# Patient Record
Sex: Male | Born: 1940 | Race: White | Hispanic: No | Marital: Married | Smoking: Never smoker
Health system: Southern US, Community
[De-identification: ages and names within clinical notes are randomized; demographics above are authoritative.]

## PROBLEM LIST (undated history)

## (undated) DIAGNOSIS — Z9359 Other cystostomy status: Secondary | ICD-10-CM

## (undated) DIAGNOSIS — R112 Nausea with vomiting, unspecified: Secondary | ICD-10-CM

## (undated) DIAGNOSIS — K219 Gastro-esophageal reflux disease without esophagitis: Secondary | ICD-10-CM

## (undated) DIAGNOSIS — H409 Unspecified glaucoma: Secondary | ICD-10-CM

## (undated) DIAGNOSIS — Z9889 Other specified postprocedural states: Secondary | ICD-10-CM

## (undated) DIAGNOSIS — N200 Calculus of kidney: Secondary | ICD-10-CM

## (undated) DIAGNOSIS — N390 Urinary tract infection, site not specified: Secondary | ICD-10-CM

## (undated) DIAGNOSIS — C801 Malignant (primary) neoplasm, unspecified: Secondary | ICD-10-CM

## (undated) DIAGNOSIS — I10 Essential (primary) hypertension: Secondary | ICD-10-CM

## (undated) HISTORY — PX: PROSTATECTOMY: SHX69

---

## 2013-12-17 DIAGNOSIS — N390 Urinary tract infection, site not specified: Secondary | ICD-10-CM

## 2013-12-17 HISTORY — DX: Urinary tract infection, site not specified: N39.0

## 2015-04-28 NOTE — Progress Notes (Signed)
 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   Plan:  Will first plan local cystoscopy to eval bladder neck. Will schedule at [place] next week. Explained issues with recurrent bladder neck contracture and difficulties with management. Ideally, resection with CIC would be best option. Explained risk of SUI after these interventions.  Referring Physician:  Lynwood LITTIE Fruits, MD [address] Emergency [facility] [id number] [facility] [place], [place] [address]  PCP:  WONDA VEAR HENDERSON, NP  No chief complaint on file.   Subjective:  HPI:  74 y.o. male seen in consultation at the request of Fruits Lynwood LITTIE, MD for evaluation. The patient has had no chief complaint listed for this encounter. 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 Had prostate ca followed by Dr. Sallyanne in [place]. Underwent open RRP 10/2014. Stayed in hospital 9 days. Had foley for one month.   After removal of foley, immediately began having issues with inability to void and dribbling urine. Found to have bladder neck contracture requiring multiple dilations and procedures. Tried a period of CIC, but became unable to pass catheter. Has essentially had an indwelling catheter off and on for several months.  Just had new catheter placed yesterday. Plan was to keep in place for 2 weeks.  +fam hx of prostate ca in both brothers.  No records on path - daughter says Gleason 7, with what sounds like positive margin. But plan was to just monitor psa.  PMH: Past Medical History  Diagnosis Date  . Hypertension   . Depression   . GERD (gastroesophageal reflux disease)   . Hiatal hernia   . Cancer ([facility]-HCC)     prostate    PSH: Past Surgical History  Procedure Laterality Date  . Retropubic prostatectomy  10/2014    [place]   . Cystoscopy w/ ureteroscopy w/ lithotripsy      Medications: Current Outpatient Prescriptions  Medication Sig Dispense Refill  .  amLODIPine-benazepril (LOTREL) 10-20 mg per capsule Take 1 capsule by mouth daily.    SABRA aspirin (ECOTRIN) 81 MG tablet Take 81 mg by mouth daily.    SABRA DEXILANT 60 mg capsule Take 1 capsule by mouth daily. As directed    . escitalopram oxalate (LEXAPRO) 20 MG tablet Take 20 mg by mouth daily.    . levofloxacin  (LEVAQUIN ) 500 MG tablet Take 500 mg by mouth daily.     No current facility-administered medications for this visit.    Allergies: Review of patient's allergies indicates no known allergies.   Social History: Patient  reports that he has never smoked. He does not have any smokeless tobacco history on file. He reports that he does not drink alcohol or use illicit drugs.   Family History: The patient's family history is not on file.   ROS:  A comprehensive 10-system review was negative, except as noted in HPI.   BP 145/80 mmHg  Pulse 79  Temp(Src) 36.3 C (97.3 F) (Oral)  Ht 180.3 cm (5' 11)  Wt 112.81 kg (248 lb 11.2 oz)  BMI 34.70 kg/m2  Physical Exam:  General: well developed, well nourished, no acute distress HEENT: PERLA, EOM intact, normocephalic, atraumatic Neck: supple and no masses Chest: symmetrical Lungs: non-labored breathing Heart: normal rhythm, no JVD Abdomen: no tenderness, no masses or hernias, no palpable organomegaly, healed low midline incision GU: circ, nl glans/meatus, testicles normal bilaterally, urine draining clear Rectal: Extremities: no deformities, no edema, no cyanosis

## 2015-12-20 DIAGNOSIS — N3946 Mixed incontinence: Secondary | ICD-10-CM | POA: Diagnosis not present

## 2015-12-20 DIAGNOSIS — N318 Other neuromuscular dysfunction of bladder: Secondary | ICD-10-CM | POA: Diagnosis not present

## 2015-12-20 DIAGNOSIS — N3020 Other chronic cystitis without hematuria: Secondary | ICD-10-CM | POA: Diagnosis not present

## 2015-12-20 DIAGNOSIS — N3090 Cystitis, unspecified without hematuria: Secondary | ICD-10-CM | POA: Diagnosis not present

## 2015-12-20 DIAGNOSIS — N320 Bladder-neck obstruction: Secondary | ICD-10-CM | POA: Diagnosis not present

## 2015-12-23 DIAGNOSIS — J329 Chronic sinusitis, unspecified: Secondary | ICD-10-CM | POA: Diagnosis not present

## 2015-12-23 DIAGNOSIS — E669 Obesity, unspecified: Secondary | ICD-10-CM | POA: Diagnosis not present

## 2015-12-23 DIAGNOSIS — I10 Essential (primary) hypertension: Secondary | ICD-10-CM | POA: Diagnosis not present

## 2015-12-23 DIAGNOSIS — F324 Major depressive disorder, single episode, in partial remission: Secondary | ICD-10-CM | POA: Diagnosis not present

## 2015-12-23 DIAGNOSIS — K219 Gastro-esophageal reflux disease without esophagitis: Secondary | ICD-10-CM | POA: Diagnosis not present

## 2015-12-23 DIAGNOSIS — E119 Type 2 diabetes mellitus without complications: Secondary | ICD-10-CM | POA: Diagnosis not present

## 2015-12-23 DIAGNOSIS — Z8546 Personal history of malignant neoplasm of prostate: Secondary | ICD-10-CM | POA: Diagnosis not present

## 2016-01-10 DIAGNOSIS — F324 Major depressive disorder, single episode, in partial remission: Secondary | ICD-10-CM | POA: Diagnosis not present

## 2016-01-10 DIAGNOSIS — E669 Obesity, unspecified: Secondary | ICD-10-CM | POA: Diagnosis not present

## 2016-01-10 DIAGNOSIS — I10 Essential (primary) hypertension: Secondary | ICD-10-CM | POA: Diagnosis not present

## 2016-01-10 DIAGNOSIS — N390 Urinary tract infection, site not specified: Secondary | ICD-10-CM | POA: Diagnosis not present

## 2016-01-10 DIAGNOSIS — R319 Hematuria, unspecified: Secondary | ICD-10-CM | POA: Diagnosis not present

## 2016-01-10 DIAGNOSIS — K219 Gastro-esophageal reflux disease without esophagitis: Secondary | ICD-10-CM | POA: Diagnosis not present

## 2016-01-10 DIAGNOSIS — Z9079 Acquired absence of other genital organ(s): Secondary | ICD-10-CM | POA: Diagnosis not present

## 2016-01-19 DIAGNOSIS — N3090 Cystitis, unspecified without hematuria: Secondary | ICD-10-CM | POA: Diagnosis not present

## 2016-01-19 DIAGNOSIS — N320 Bladder-neck obstruction: Secondary | ICD-10-CM | POA: Diagnosis not present

## 2016-01-19 DIAGNOSIS — C61 Malignant neoplasm of prostate: Secondary | ICD-10-CM | POA: Diagnosis not present

## 2016-01-19 DIAGNOSIS — N3946 Mixed incontinence: Secondary | ICD-10-CM | POA: Diagnosis not present

## 2016-01-19 DIAGNOSIS — N318 Other neuromuscular dysfunction of bladder: Secondary | ICD-10-CM | POA: Diagnosis not present

## 2016-01-19 DIAGNOSIS — N3020 Other chronic cystitis without hematuria: Secondary | ICD-10-CM | POA: Diagnosis not present

## 2016-02-20 DIAGNOSIS — N3090 Cystitis, unspecified without hematuria: Secondary | ICD-10-CM | POA: Diagnosis not present

## 2016-02-20 DIAGNOSIS — N3946 Mixed incontinence: Secondary | ICD-10-CM | POA: Diagnosis not present

## 2016-02-20 DIAGNOSIS — N320 Bladder-neck obstruction: Secondary | ICD-10-CM | POA: Diagnosis not present

## 2016-02-20 DIAGNOSIS — N318 Other neuromuscular dysfunction of bladder: Secondary | ICD-10-CM | POA: Diagnosis not present

## 2016-02-20 DIAGNOSIS — C61 Malignant neoplasm of prostate: Secondary | ICD-10-CM | POA: Diagnosis not present

## 2016-02-20 DIAGNOSIS — N3020 Other chronic cystitis without hematuria: Secondary | ICD-10-CM | POA: Diagnosis not present

## 2016-02-22 DIAGNOSIS — C61 Malignant neoplasm of prostate: Secondary | ICD-10-CM | POA: Diagnosis not present

## 2016-02-22 DIAGNOSIS — N3946 Mixed incontinence: Secondary | ICD-10-CM | POA: Diagnosis not present

## 2016-02-22 DIAGNOSIS — I10 Essential (primary) hypertension: Secondary | ICD-10-CM | POA: Diagnosis not present

## 2016-02-22 DIAGNOSIS — E785 Hyperlipidemia, unspecified: Secondary | ICD-10-CM | POA: Diagnosis not present

## 2016-02-22 DIAGNOSIS — E669 Obesity, unspecified: Secondary | ICD-10-CM | POA: Diagnosis not present

## 2016-02-22 DIAGNOSIS — Z6837 Body mass index (BMI) 37.0-37.9, adult: Secondary | ICD-10-CM | POA: Diagnosis not present

## 2016-02-22 DIAGNOSIS — Z79899 Other long term (current) drug therapy: Secondary | ICD-10-CM | POA: Diagnosis not present

## 2016-02-22 DIAGNOSIS — F419 Anxiety disorder, unspecified: Secondary | ICD-10-CM | POA: Diagnosis not present

## 2016-02-22 DIAGNOSIS — N320 Bladder-neck obstruction: Secondary | ICD-10-CM | POA: Diagnosis not present

## 2016-02-22 DIAGNOSIS — F329 Major depressive disorder, single episode, unspecified: Secondary | ICD-10-CM | POA: Diagnosis not present

## 2016-02-28 DIAGNOSIS — C61 Malignant neoplasm of prostate: Secondary | ICD-10-CM | POA: Diagnosis not present

## 2016-02-28 DIAGNOSIS — N3946 Mixed incontinence: Secondary | ICD-10-CM | POA: Diagnosis not present

## 2016-02-28 DIAGNOSIS — R338 Other retention of urine: Secondary | ICD-10-CM | POA: Diagnosis not present

## 2016-02-28 DIAGNOSIS — N3020 Other chronic cystitis without hematuria: Secondary | ICD-10-CM | POA: Diagnosis not present

## 2016-02-28 DIAGNOSIS — N320 Bladder-neck obstruction: Secondary | ICD-10-CM | POA: Diagnosis not present

## 2016-03-14 DIAGNOSIS — C61 Malignant neoplasm of prostate: Secondary | ICD-10-CM | POA: Diagnosis not present

## 2016-03-14 DIAGNOSIS — N3020 Other chronic cystitis without hematuria: Secondary | ICD-10-CM | POA: Diagnosis not present

## 2016-03-14 DIAGNOSIS — N453 Epididymo-orchitis: Secondary | ICD-10-CM | POA: Diagnosis not present

## 2016-03-14 DIAGNOSIS — N3090 Cystitis, unspecified without hematuria: Secondary | ICD-10-CM | POA: Diagnosis not present

## 2016-03-21 DIAGNOSIS — C61 Malignant neoplasm of prostate: Secondary | ICD-10-CM | POA: Diagnosis not present

## 2016-03-21 DIAGNOSIS — N3946 Mixed incontinence: Secondary | ICD-10-CM | POA: Diagnosis not present

## 2016-03-21 DIAGNOSIS — N3020 Other chronic cystitis without hematuria: Secondary | ICD-10-CM | POA: Diagnosis not present

## 2016-03-21 DIAGNOSIS — R338 Other retention of urine: Secondary | ICD-10-CM | POA: Diagnosis not present

## 2016-03-21 DIAGNOSIS — N453 Epididymo-orchitis: Secondary | ICD-10-CM | POA: Diagnosis not present

## 2016-03-27 DIAGNOSIS — N318 Other neuromuscular dysfunction of bladder: Secondary | ICD-10-CM | POA: Diagnosis not present

## 2016-03-27 DIAGNOSIS — N3020 Other chronic cystitis without hematuria: Secondary | ICD-10-CM | POA: Diagnosis not present

## 2016-03-27 DIAGNOSIS — N453 Epididymo-orchitis: Secondary | ICD-10-CM | POA: Diagnosis not present

## 2016-03-27 DIAGNOSIS — C61 Malignant neoplasm of prostate: Secondary | ICD-10-CM | POA: Diagnosis not present

## 2016-03-27 DIAGNOSIS — N3946 Mixed incontinence: Secondary | ICD-10-CM | POA: Diagnosis not present

## 2016-03-27 DIAGNOSIS — N320 Bladder-neck obstruction: Secondary | ICD-10-CM | POA: Diagnosis not present

## 2016-04-24 DIAGNOSIS — N318 Other neuromuscular dysfunction of bladder: Secondary | ICD-10-CM | POA: Diagnosis not present

## 2016-04-24 DIAGNOSIS — N201 Calculus of ureter: Secondary | ICD-10-CM | POA: Diagnosis not present

## 2016-04-24 DIAGNOSIS — N3090 Cystitis, unspecified without hematuria: Secondary | ICD-10-CM | POA: Diagnosis not present

## 2016-04-24 DIAGNOSIS — C61 Malignant neoplasm of prostate: Secondary | ICD-10-CM | POA: Diagnosis not present

## 2016-04-24 DIAGNOSIS — N3020 Other chronic cystitis without hematuria: Secondary | ICD-10-CM | POA: Diagnosis not present

## 2016-05-16 DIAGNOSIS — L0232 Furuncle of buttock: Secondary | ICD-10-CM | POA: Diagnosis not present

## 2016-05-24 DIAGNOSIS — C61 Malignant neoplasm of prostate: Secondary | ICD-10-CM | POA: Diagnosis not present

## 2016-05-24 DIAGNOSIS — N3946 Mixed incontinence: Secondary | ICD-10-CM | POA: Diagnosis not present

## 2016-05-24 DIAGNOSIS — N3020 Other chronic cystitis without hematuria: Secondary | ICD-10-CM | POA: Diagnosis not present

## 2016-05-24 DIAGNOSIS — N3090 Cystitis, unspecified without hematuria: Secondary | ICD-10-CM | POA: Diagnosis not present

## 2016-05-24 DIAGNOSIS — N320 Bladder-neck obstruction: Secondary | ICD-10-CM | POA: Diagnosis not present

## 2016-06-22 DIAGNOSIS — N3946 Mixed incontinence: Secondary | ICD-10-CM | POA: Diagnosis not present

## 2016-06-22 DIAGNOSIS — N3020 Other chronic cystitis without hematuria: Secondary | ICD-10-CM | POA: Diagnosis not present

## 2016-06-22 DIAGNOSIS — C61 Malignant neoplasm of prostate: Secondary | ICD-10-CM | POA: Diagnosis not present

## 2016-06-22 DIAGNOSIS — N320 Bladder-neck obstruction: Secondary | ICD-10-CM | POA: Diagnosis not present

## 2016-06-22 DIAGNOSIS — N453 Epididymo-orchitis: Secondary | ICD-10-CM | POA: Diagnosis not present

## 2016-06-22 DIAGNOSIS — N3090 Cystitis, unspecified without hematuria: Secondary | ICD-10-CM | POA: Diagnosis not present

## 2016-07-24 DIAGNOSIS — N201 Calculus of ureter: Secondary | ICD-10-CM | POA: Diagnosis not present

## 2016-07-24 DIAGNOSIS — R338 Other retention of urine: Secondary | ICD-10-CM | POA: Diagnosis not present

## 2016-07-24 DIAGNOSIS — R351 Nocturia: Secondary | ICD-10-CM | POA: Diagnosis not present

## 2016-07-24 DIAGNOSIS — N3020 Other chronic cystitis without hematuria: Secondary | ICD-10-CM | POA: Diagnosis not present

## 2016-07-24 DIAGNOSIS — N3090 Cystitis, unspecified without hematuria: Secondary | ICD-10-CM | POA: Diagnosis not present

## 2016-07-24 DIAGNOSIS — N318 Other neuromuscular dysfunction of bladder: Secondary | ICD-10-CM | POA: Diagnosis not present

## 2016-07-24 DIAGNOSIS — N200 Calculus of kidney: Secondary | ICD-10-CM | POA: Diagnosis not present

## 2016-07-31 DIAGNOSIS — N320 Bladder-neck obstruction: Secondary | ICD-10-CM | POA: Diagnosis not present

## 2016-07-31 DIAGNOSIS — C61 Malignant neoplasm of prostate: Secondary | ICD-10-CM | POA: Diagnosis not present

## 2016-07-31 DIAGNOSIS — N3946 Mixed incontinence: Secondary | ICD-10-CM | POA: Diagnosis not present

## 2016-07-31 DIAGNOSIS — N3020 Other chronic cystitis without hematuria: Secondary | ICD-10-CM | POA: Diagnosis not present

## 2016-08-21 DIAGNOSIS — C61 Malignant neoplasm of prostate: Secondary | ICD-10-CM | POA: Diagnosis not present

## 2016-08-21 DIAGNOSIS — N320 Bladder-neck obstruction: Secondary | ICD-10-CM | POA: Diagnosis not present

## 2016-08-21 DIAGNOSIS — N3020 Other chronic cystitis without hematuria: Secondary | ICD-10-CM | POA: Diagnosis not present

## 2016-08-21 DIAGNOSIS — N3946 Mixed incontinence: Secondary | ICD-10-CM | POA: Diagnosis not present

## 2016-09-03 DIAGNOSIS — Z23 Encounter for immunization: Secondary | ICD-10-CM | POA: Diagnosis not present

## 2016-09-20 DIAGNOSIS — N3090 Cystitis, unspecified without hematuria: Secondary | ICD-10-CM | POA: Diagnosis not present

## 2016-09-20 DIAGNOSIS — N3020 Other chronic cystitis without hematuria: Secondary | ICD-10-CM | POA: Diagnosis not present

## 2016-09-20 DIAGNOSIS — N318 Other neuromuscular dysfunction of bladder: Secondary | ICD-10-CM | POA: Diagnosis not present

## 2016-09-20 DIAGNOSIS — C61 Malignant neoplasm of prostate: Secondary | ICD-10-CM | POA: Diagnosis not present

## 2016-10-22 DIAGNOSIS — C61 Malignant neoplasm of prostate: Secondary | ICD-10-CM | POA: Diagnosis not present

## 2016-10-22 DIAGNOSIS — N318 Other neuromuscular dysfunction of bladder: Secondary | ICD-10-CM | POA: Diagnosis not present

## 2016-10-22 DIAGNOSIS — N3090 Cystitis, unspecified without hematuria: Secondary | ICD-10-CM | POA: Diagnosis not present

## 2016-10-22 DIAGNOSIS — N3020 Other chronic cystitis without hematuria: Secondary | ICD-10-CM | POA: Diagnosis not present

## 2016-11-19 DIAGNOSIS — N318 Other neuromuscular dysfunction of bladder: Secondary | ICD-10-CM | POA: Diagnosis not present

## 2016-11-19 DIAGNOSIS — N3090 Cystitis, unspecified without hematuria: Secondary | ICD-10-CM | POA: Diagnosis not present

## 2016-11-19 DIAGNOSIS — C61 Malignant neoplasm of prostate: Secondary | ICD-10-CM | POA: Diagnosis not present

## 2016-11-19 DIAGNOSIS — N3020 Other chronic cystitis without hematuria: Secondary | ICD-10-CM | POA: Diagnosis not present

## 2016-12-03 DIAGNOSIS — Z5321 Procedure and treatment not carried out due to patient leaving prior to being seen by health care provider: Secondary | ICD-10-CM | POA: Diagnosis not present

## 2016-12-03 DIAGNOSIS — R300 Dysuria: Secondary | ICD-10-CM | POA: Diagnosis not present

## 2016-12-21 DIAGNOSIS — N318 Other neuromuscular dysfunction of bladder: Secondary | ICD-10-CM | POA: Diagnosis not present

## 2016-12-21 DIAGNOSIS — C61 Malignant neoplasm of prostate: Secondary | ICD-10-CM | POA: Diagnosis not present

## 2016-12-21 DIAGNOSIS — N3090 Cystitis, unspecified without hematuria: Secondary | ICD-10-CM | POA: Diagnosis not present

## 2016-12-21 DIAGNOSIS — N3020 Other chronic cystitis without hematuria: Secondary | ICD-10-CM | POA: Diagnosis not present

## 2017-01-10 DIAGNOSIS — J3489 Other specified disorders of nose and nasal sinuses: Secondary | ICD-10-CM | POA: Diagnosis not present

## 2017-01-21 DIAGNOSIS — N318 Other neuromuscular dysfunction of bladder: Secondary | ICD-10-CM | POA: Diagnosis not present

## 2017-01-21 DIAGNOSIS — N3090 Cystitis, unspecified without hematuria: Secondary | ICD-10-CM | POA: Diagnosis not present

## 2017-01-21 DIAGNOSIS — N3020 Other chronic cystitis without hematuria: Secondary | ICD-10-CM | POA: Diagnosis not present

## 2017-01-21 DIAGNOSIS — C61 Malignant neoplasm of prostate: Secondary | ICD-10-CM | POA: Diagnosis not present

## 2017-02-18 DIAGNOSIS — N3090 Cystitis, unspecified without hematuria: Secondary | ICD-10-CM | POA: Diagnosis not present

## 2017-02-18 DIAGNOSIS — N3020 Other chronic cystitis without hematuria: Secondary | ICD-10-CM | POA: Diagnosis not present

## 2017-02-18 DIAGNOSIS — C61 Malignant neoplasm of prostate: Secondary | ICD-10-CM | POA: Diagnosis not present

## 2017-02-18 DIAGNOSIS — N318 Other neuromuscular dysfunction of bladder: Secondary | ICD-10-CM | POA: Diagnosis not present

## 2017-02-26 DIAGNOSIS — N3000 Acute cystitis without hematuria: Secondary | ICD-10-CM | POA: Diagnosis not present

## 2017-02-27 DIAGNOSIS — N3000 Acute cystitis without hematuria: Secondary | ICD-10-CM | POA: Diagnosis not present

## 2017-02-28 DIAGNOSIS — N3000 Acute cystitis without hematuria: Secondary | ICD-10-CM | POA: Diagnosis not present

## 2017-03-01 DIAGNOSIS — N3000 Acute cystitis without hematuria: Secondary | ICD-10-CM | POA: Diagnosis not present

## 2017-03-02 DIAGNOSIS — N3000 Acute cystitis without hematuria: Secondary | ICD-10-CM | POA: Diagnosis not present

## 2017-03-03 DIAGNOSIS — N3000 Acute cystitis without hematuria: Secondary | ICD-10-CM | POA: Diagnosis not present

## 2017-03-04 DIAGNOSIS — N3000 Acute cystitis without hematuria: Secondary | ICD-10-CM | POA: Diagnosis not present

## 2017-03-25 DIAGNOSIS — C61 Malignant neoplasm of prostate: Secondary | ICD-10-CM | POA: Diagnosis not present

## 2017-03-25 DIAGNOSIS — N3020 Other chronic cystitis without hematuria: Secondary | ICD-10-CM | POA: Diagnosis not present

## 2017-03-25 DIAGNOSIS — N3090 Cystitis, unspecified without hematuria: Secondary | ICD-10-CM | POA: Diagnosis not present

## 2017-03-25 DIAGNOSIS — N318 Other neuromuscular dysfunction of bladder: Secondary | ICD-10-CM | POA: Diagnosis not present

## 2017-03-29 DIAGNOSIS — N3020 Other chronic cystitis without hematuria: Secondary | ICD-10-CM | POA: Diagnosis not present

## 2017-03-30 DIAGNOSIS — N3020 Other chronic cystitis without hematuria: Secondary | ICD-10-CM | POA: Diagnosis not present

## 2017-03-31 DIAGNOSIS — N3020 Other chronic cystitis without hematuria: Secondary | ICD-10-CM | POA: Diagnosis not present

## 2017-04-01 DIAGNOSIS — N3020 Other chronic cystitis without hematuria: Secondary | ICD-10-CM | POA: Diagnosis not present

## 2017-04-02 DIAGNOSIS — N3020 Other chronic cystitis without hematuria: Secondary | ICD-10-CM | POA: Diagnosis not present

## 2017-04-03 DIAGNOSIS — N3020 Other chronic cystitis without hematuria: Secondary | ICD-10-CM | POA: Diagnosis not present

## 2017-04-04 DIAGNOSIS — N3020 Other chronic cystitis without hematuria: Secondary | ICD-10-CM | POA: Diagnosis not present

## 2017-04-05 DIAGNOSIS — N3020 Other chronic cystitis without hematuria: Secondary | ICD-10-CM | POA: Diagnosis not present

## 2017-04-06 DIAGNOSIS — N3020 Other chronic cystitis without hematuria: Secondary | ICD-10-CM | POA: Diagnosis not present

## 2017-04-07 DIAGNOSIS — N3020 Other chronic cystitis without hematuria: Secondary | ICD-10-CM | POA: Diagnosis not present

## 2017-04-22 DIAGNOSIS — N3000 Acute cystitis without hematuria: Secondary | ICD-10-CM | POA: Diagnosis not present

## 2017-04-22 DIAGNOSIS — C61 Malignant neoplasm of prostate: Secondary | ICD-10-CM | POA: Diagnosis not present

## 2017-04-22 DIAGNOSIS — N200 Calculus of kidney: Secondary | ICD-10-CM | POA: Diagnosis not present

## 2017-04-22 DIAGNOSIS — N35012 Post-traumatic membranous urethral stricture: Secondary | ICD-10-CM | POA: Diagnosis not present

## 2017-04-22 DIAGNOSIS — R3982 Chronic bladder pain: Secondary | ICD-10-CM | POA: Diagnosis not present

## 2017-05-01 DIAGNOSIS — Z0001 Encounter for general adult medical examination with abnormal findings: Secondary | ICD-10-CM | POA: Diagnosis not present

## 2017-05-01 DIAGNOSIS — K219 Gastro-esophageal reflux disease without esophagitis: Secondary | ICD-10-CM | POA: Diagnosis not present

## 2017-05-01 DIAGNOSIS — Z23 Encounter for immunization: Secondary | ICD-10-CM | POA: Diagnosis not present

## 2017-05-01 DIAGNOSIS — R0609 Other forms of dyspnea: Secondary | ICD-10-CM | POA: Diagnosis not present

## 2017-05-01 DIAGNOSIS — F324 Major depressive disorder, single episode, in partial remission: Secondary | ICD-10-CM | POA: Diagnosis not present

## 2017-05-01 DIAGNOSIS — I10 Essential (primary) hypertension: Secondary | ICD-10-CM | POA: Diagnosis not present

## 2017-05-06 NOTE — Telephone Encounter (Signed)
 Left message to return call to the office as soon as possible.

## 2017-05-08 DIAGNOSIS — D649 Anemia, unspecified: Secondary | ICD-10-CM | POA: Diagnosis not present

## 2017-05-20 DIAGNOSIS — R0600 Dyspnea, unspecified: Secondary | ICD-10-CM | POA: Diagnosis not present

## 2017-05-20 DIAGNOSIS — R0609 Other forms of dyspnea: Secondary | ICD-10-CM | POA: Diagnosis not present

## 2017-05-22 DIAGNOSIS — E611 Iron deficiency: Secondary | ICD-10-CM | POA: Diagnosis not present

## 2017-05-23 DIAGNOSIS — N3000 Acute cystitis without hematuria: Secondary | ICD-10-CM | POA: Diagnosis not present

## 2017-05-23 DIAGNOSIS — C61 Malignant neoplasm of prostate: Secondary | ICD-10-CM | POA: Diagnosis not present

## 2017-05-23 DIAGNOSIS — N35012 Post-traumatic membranous urethral stricture: Secondary | ICD-10-CM | POA: Diagnosis not present

## 2017-05-23 DIAGNOSIS — R3982 Chronic bladder pain: Secondary | ICD-10-CM | POA: Diagnosis not present

## 2017-05-30 ENCOUNTER — Other Ambulatory Visit: Payer: Self-pay | Admitting: Urology

## 2017-05-30 ENCOUNTER — Encounter (HOSPITAL_COMMUNITY)
Admission: EM | Disposition: A | Discharge status: Home / Self Care | Payer: Self-pay | Source: Home / Self Care | Attending: Emergency Medicine

## 2017-05-30 ENCOUNTER — Emergency Department (HOSPITAL_COMMUNITY): Payer: Medicare Other | Admitting: Certified Registered Nurse Anesthetist

## 2017-05-30 ENCOUNTER — Emergency Department (HOSPITAL_COMMUNITY): Payer: Medicare Other

## 2017-05-30 ENCOUNTER — Encounter (HOSPITAL_COMMUNITY): Payer: Self-pay | Admitting: Emergency Medicine

## 2017-05-30 ENCOUNTER — Emergency Department (HOSPITAL_COMMUNITY)
Admission: EM | Admit: 2017-05-30 | Discharge: 2017-05-30 | Disposition: A | Discharge status: Home / Self Care | Payer: Medicare Other | Attending: Emergency Medicine | Admitting: Emergency Medicine

## 2017-05-30 DIAGNOSIS — Z833 Family history of diabetes mellitus: Secondary | ICD-10-CM | POA: Diagnosis not present

## 2017-05-30 DIAGNOSIS — Z1619 Resistance to other specified beta lactam antibiotics: Secondary | ICD-10-CM | POA: Insufficient documentation

## 2017-05-30 DIAGNOSIS — R339 Retention of urine, unspecified: Secondary | ICD-10-CM | POA: Diagnosis not present

## 2017-05-30 DIAGNOSIS — Z1623 Resistance to quinolones and fluoroquinolones: Secondary | ICD-10-CM | POA: Insufficient documentation

## 2017-05-30 DIAGNOSIS — N390 Urinary tract infection, site not specified: Secondary | ICD-10-CM | POA: Diagnosis not present

## 2017-05-30 DIAGNOSIS — K219 Gastro-esophageal reflux disease without esophagitis: Secondary | ICD-10-CM | POA: Diagnosis not present

## 2017-05-30 DIAGNOSIS — Z1629 Resistance to other single specified antibiotic: Secondary | ICD-10-CM | POA: Insufficient documentation

## 2017-05-30 DIAGNOSIS — C61 Malignant neoplasm of prostate: Secondary | ICD-10-CM | POA: Diagnosis not present

## 2017-05-30 DIAGNOSIS — R32 Unspecified urinary incontinence: Secondary | ICD-10-CM | POA: Diagnosis not present

## 2017-05-30 DIAGNOSIS — H409 Unspecified glaucoma: Secondary | ICD-10-CM | POA: Insufficient documentation

## 2017-05-30 DIAGNOSIS — I10 Essential (primary) hypertension: Secondary | ICD-10-CM | POA: Insufficient documentation

## 2017-05-30 DIAGNOSIS — Z1611 Resistance to penicillins: Secondary | ICD-10-CM | POA: Insufficient documentation

## 2017-05-30 DIAGNOSIS — N359 Urethral stricture, unspecified: Secondary | ICD-10-CM | POA: Insufficient documentation

## 2017-05-30 DIAGNOSIS — D649 Anemia, unspecified: Secondary | ICD-10-CM | POA: Diagnosis not present

## 2017-05-30 DIAGNOSIS — R338 Other retention of urine: Secondary | ICD-10-CM | POA: Diagnosis not present

## 2017-05-30 DIAGNOSIS — Z8249 Family history of ischemic heart disease and other diseases of the circulatory system: Secondary | ICD-10-CM | POA: Diagnosis not present

## 2017-05-30 DIAGNOSIS — Z8546 Personal history of malignant neoplasm of prostate: Secondary | ICD-10-CM | POA: Diagnosis not present

## 2017-05-30 DIAGNOSIS — Z87442 Personal history of urinary calculi: Secondary | ICD-10-CM | POA: Insufficient documentation

## 2017-05-30 DIAGNOSIS — Z9079 Acquired absence of other genital organ(s): Secondary | ICD-10-CM | POA: Diagnosis not present

## 2017-05-30 DIAGNOSIS — Z8042 Family history of malignant neoplasm of prostate: Secondary | ICD-10-CM | POA: Insufficient documentation

## 2017-05-30 DIAGNOSIS — N320 Bladder-neck obstruction: Secondary | ICD-10-CM | POA: Diagnosis not present

## 2017-05-30 DIAGNOSIS — N35012 Post-traumatic membranous urethral stricture: Secondary | ICD-10-CM | POA: Diagnosis not present

## 2017-05-30 DIAGNOSIS — N3000 Acute cystitis without hematuria: Secondary | ICD-10-CM | POA: Diagnosis not present

## 2017-05-30 DIAGNOSIS — B9620 Unspecified Escherichia coli [E. coli] as the cause of diseases classified elsewhere: Secondary | ICD-10-CM | POA: Diagnosis not present

## 2017-05-30 HISTORY — DX: Calculus of kidney: N20.0

## 2017-05-30 HISTORY — DX: Malignant (primary) neoplasm, unspecified: C80.1

## 2017-05-30 HISTORY — DX: Gastro-esophageal reflux disease without esophagitis: K21.9

## 2017-05-30 HISTORY — DX: Essential (primary) hypertension: I10

## 2017-05-30 HISTORY — DX: Unspecified glaucoma: H40.9

## 2017-05-30 HISTORY — PX: CYSTOSCOPY/RETROGRADE/URETEROSCOPY/STONE EXTRACTION WITH BASKET: SHX5317

## 2017-05-30 LAB — CBC WITH DIFFERENTIAL/PLATELET
BASOS ABS: 0 10*3/uL (ref 0.0–0.1)
BASOS PCT: 0 %
EOS PCT: 0 %
Eosinophils Absolute: 0 10*3/uL (ref 0.0–0.7)
HCT: 30.2 % — ABNORMAL LOW (ref 39.0–52.0)
Hemoglobin: 9.5 g/dL — ABNORMAL LOW (ref 13.0–17.0)
LYMPHS PCT: 3 %
Lymphs Abs: 0.3 10*3/uL — ABNORMAL LOW (ref 0.7–4.0)
MCH: 26.4 pg (ref 26.0–34.0)
MCHC: 31.5 g/dL (ref 30.0–36.0)
MCV: 83.9 fL (ref 78.0–100.0)
Monocytes Absolute: 0.2 10*3/uL (ref 0.1–1.0)
Monocytes Relative: 2 %
Neutro Abs: 12.1 10*3/uL — ABNORMAL HIGH (ref 1.7–7.7)
Neutrophils Relative %: 95 %
PLATELETS: 191 10*3/uL (ref 150–400)
RBC: 3.6 MIL/uL — ABNORMAL LOW (ref 4.22–5.81)
RDW: 14.8 % (ref 11.5–15.5)
WBC: 12.7 10*3/uL — AB (ref 4.0–10.5)

## 2017-05-30 LAB — COMPREHENSIVE METABOLIC PANEL
ALT: 11 U/L — ABNORMAL LOW (ref 17–63)
AST: 16 U/L (ref 15–41)
Albumin: 3.2 g/dL — ABNORMAL LOW (ref 3.5–5.0)
Alkaline Phosphatase: 62 U/L (ref 38–126)
Anion gap: 11 (ref 5–15)
BILIRUBIN TOTAL: 0.5 mg/dL (ref 0.3–1.2)
BUN: 14 mg/dL (ref 6–20)
CO2: 22 mmol/L (ref 22–32)
Calcium: 8 mg/dL — ABNORMAL LOW (ref 8.9–10.3)
Chloride: 111 mmol/L (ref 101–111)
Creatinine, Ser: 1.42 mg/dL — ABNORMAL HIGH (ref 0.61–1.24)
GFR calc Af Amer: 55 mL/min — ABNORMAL LOW (ref >60)
GFR, EST NON AFRICAN AMERICAN: 47 mL/min — AB (ref >60)
Glucose, Bld: 110 mg/dL — ABNORMAL HIGH (ref 65–99)
POTASSIUM: 2.9 mmol/L — AB (ref 3.5–5.1)
Sodium: 144 mmol/L (ref 135–145)
TOTAL PROTEIN: 7 g/dL (ref 6.5–8.1)

## 2017-05-30 SURGERY — CYSTOSCOPY, WITH CALCULUS REMOVAL USING BASKET
Anesthesia: General

## 2017-05-30 MED ORDER — SUCCINYLCHOLINE CHLORIDE 200 MG/10ML IV SOSY
PREFILLED_SYRINGE | INTRAVENOUS | Status: AC
  Filled 2017-05-30: qty 10

## 2017-05-30 MED ORDER — LIDOCAINE HCL (CARDIAC) 20 MG/ML IV SOLN
INTRAVENOUS | Status: DC | PRN
  Administered 2017-05-30: 50 mg via INTRAVENOUS

## 2017-05-30 MED ORDER — LACTATED RINGERS IV SOLN
INTRAVENOUS | Status: DC
  Administered 2017-05-30 (×2): via INTRAVENOUS

## 2017-05-30 MED ORDER — ONDANSETRON HCL 4 MG/2ML IJ SOLN
INTRAMUSCULAR | Status: DC | PRN
Start: 2017-05-30 — End: 2017-05-30
  Administered 2017-05-30: 4 mg via INTRAVENOUS

## 2017-05-30 MED ORDER — ONDANSETRON HCL 4 MG/2ML IJ SOLN
INTRAMUSCULAR | Status: AC
  Filled 2017-05-30: qty 2

## 2017-05-30 MED ORDER — TRAMADOL HCL 50 MG PO TABS: 50.0000 mg | ORAL_TABLET | Freq: Four times a day (QID) | ORAL | 0 refills | Status: DC | PRN

## 2017-05-30 MED ORDER — PROPOFOL 10 MG/ML IV BOLUS
INTRAVENOUS | Status: DC | PRN
Start: 2017-05-30 — End: 2017-05-30
  Administered 2017-05-30 (×2): 150 mg via INTRAVENOUS

## 2017-05-30 MED ORDER — CIPROFLOXACIN HCL 500 MG PO TABS: 500.0000 mg | ORAL_TABLET | Freq: Two times a day (BID) | ORAL | 0 refills | Status: DC

## 2017-05-30 MED ORDER — MORPHINE SULFATE (PF) 2 MG/ML IV SOLN
4.0000 mg | Freq: Once | INTRAVENOUS | Status: AC
  Administered 2017-05-30: 4 mg via INTRAVENOUS
  Filled 2017-05-30 (×2): qty 2

## 2017-05-30 MED ORDER — DEXAMETHASONE SODIUM PHOSPHATE 4 MG/ML IJ SOLN
INTRAMUSCULAR | Status: DC | PRN
  Administered 2017-05-30: 10 mg via INTRAVENOUS

## 2017-05-30 MED ORDER — ONDANSETRON HCL 4 MG/2ML IJ SOLN: 4.0000 mg | Freq: Once | INTRAMUSCULAR | Status: DC | PRN

## 2017-05-30 MED ORDER — LIDOCAINE 2% (20 MG/ML) 5 ML SYRINGE
INTRAMUSCULAR | Status: AC
  Filled 2017-05-30: qty 5

## 2017-05-30 MED ORDER — CIPROFLOXACIN IN D5W 400 MG/200ML IV SOLN: 400.0000 mg | INTRAVENOUS | Status: DC

## 2017-05-30 MED ORDER — DEXAMETHASONE SODIUM PHOSPHATE 10 MG/ML IJ SOLN
INTRAMUSCULAR | Status: AC
  Filled 2017-05-30: qty 1

## 2017-05-30 MED ORDER — PROPOFOL 10 MG/ML IV BOLUS
INTRAVENOUS | Status: AC
  Filled 2017-05-30: qty 20

## 2017-05-30 MED ORDER — FENTANYL CITRATE (PF) 100 MCG/2ML IJ SOLN
INTRAMUSCULAR | Status: DC | PRN
Start: 2017-05-30 — End: 2017-05-30
  Administered 2017-05-30 (×2): 25 ug via INTRAVENOUS
  Administered 2017-05-30: 50 ug via INTRAVENOUS

## 2017-05-30 MED ORDER — FENTANYL CITRATE (PF) 100 MCG/2ML IJ SOLN
INTRAMUSCULAR | Status: AC
  Filled 2017-05-30: qty 2

## 2017-05-30 MED ORDER — FENTANYL CITRATE (PF) 100 MCG/2ML IJ SOLN: 25.0000 ug | INTRAMUSCULAR | Status: DC | PRN

## 2017-05-30 SURGICAL SUPPLY — 21 items
BAG URO CATCHER STRL LF (MISCELLANEOUS) ×3 IMPLANT
BALLN NEPHROSTOMY (BALLOONS) ×3
BALLOON NEPHROSTOMY (BALLOONS) ×1 IMPLANT
BASKET ZERO TIP 1.9FR (BASKET) IMPLANT
CATH FOLEY 2W COUNCIL 20FR 5CC (CATHETERS) ×3 IMPLANT
CATH INTERMIT  6FR 70CM (CATHETERS) ×3 IMPLANT
CLOTH BEACON ORANGE TIMEOUT ST (SAFETY) ×3 IMPLANT
COVER SURGICAL LIGHT HANDLE (MISCELLANEOUS) IMPLANT
FIBER LASER FLEXIVA 550 (UROLOGICAL SUPPLIES) ×3 IMPLANT
GLOVE BIOGEL M 8.0 STRL (GLOVE) ×9 IMPLANT
GOWN STRL REUS W/TWL XL LVL3 (GOWN DISPOSABLE) ×6 IMPLANT
GUIDEWIRE STR DUAL SENSOR (WIRE) ×3 IMPLANT
IV NS 1000ML (IV SOLUTION) ×2
IV NS 1000ML BAXH (IV SOLUTION) ×1 IMPLANT
MANIFOLD NEPTUNE II (INSTRUMENTS) ×3 IMPLANT
PACK CYSTO (CUSTOM PROCEDURE TRAY) ×3 IMPLANT
SHEATH ACCESS URETERAL 24CM (SHEATH) IMPLANT
SHEATH ACCESS URETERAL 38CM (SHEATH) IMPLANT
SHEATH ACCESS URETERAL 54CM (SHEATH) IMPLANT
TUBING CONNECTING 10 (TUBING) ×2 IMPLANT
TUBING CONNECTING 10' (TUBING) ×1

## 2017-05-30 NOTE — Transfer of Care (Signed)
Immediate Anesthesia Transfer of Care Note  Patient: Blake Cruz  Procedure(s) Performed: Procedure(s): CYSTOSCOPY WITH BALLOON DILATION OF BLADDER NECK CONRACTURE, LASER, INCISION OF BLADDER NECK, CONTRACTURE, DIFFICULT FOLEY PLACEMENT (N/A)  Patient Location: PACU  Anesthesia Type:General  Level of Consciousness:  sedated, patient cooperative and responds to stimulation  Airway & Oxygen Therapy:Patient Spontanous Breathing and Patient connected to face mask oxgen  Post-op Assessment:  Report given to PACU RN and Post -op Vital signs reviewed and stable  Post vital signs:  Reviewed and stable  Last Vitals:  Vitals:   05/30/17 1510 05/30/17 1620  BP: (!) 146/87 135/73  Pulse: 88 89  Resp: 18 15  Temp:  37 C    Complications: No apparent anesthesia complications

## 2017-05-30 NOTE — Anesthesia Postprocedure Evaluation (Signed)
Anesthesia Post Note  Patient: Naval architect  Procedure(s) Performed: Procedure(s) (LRB): CYSTOSCOPY WITH BALLOON DILATION OF BLADDER NECK CONRACTURE, LASER, INCISION OF BLADDER NECK, CONTRACTURE, DIFFICULT FOLEY PLACEMENT (N/A)     Patient location during evaluation: PACU Anesthesia Type: General Level of consciousness: awake and alert Pain management: pain level controlled Vital Signs Assessment: post-procedure vital signs reviewed and stable Respiratory status: spontaneous breathing, nonlabored ventilation, respiratory function stable and patient connected to nasal cannula oxygen Cardiovascular status: blood pressure returned to baseline and stable Postop Assessment: no signs of nausea or vomiting Anesthetic complications: no    Last Vitals:  Vitals:   05/30/17 1715 05/30/17 1720  BP: 123/71   Pulse: 65 67  Resp: 13 12  Temp:  36.9 C    Last Pain:  Vitals:   05/30/17 1220  TempSrc:   PainSc: 10-Worst pain ever                 Catalina Gravel

## 2017-05-30 NOTE — Progress Notes (Signed)
Patient's daughter is very upset that supra pubic tube was not placed today. RN tries to explain IR at [facility] could not accomadate this procedure on such short notice as they had a full schedule. The note daughter received from Francoise Ceo NP from [facility] stated that IR could not do supra pubic today.

## 2017-05-30 NOTE — Discharge Instructions (Signed)
Indwelling Urinary Catheter Care, Adult Take good care of your catheter to keep it working and to prevent problems. How to wear your catheter Attach your catheter to your leg with tape (adhesive tape) or a leg strap. Make sure it is not too tight. If you use tape, remove any bits of tape that are already on the catheter. How to wear a drainage bag You should have:  A large overnight bag.  A small leg bag.  Overnight Bag You may wear the overnight bag at any time. Always keep the bag below the level of your bladder but off the floor. When you sleep, put a clean plastic bag in a wastebasket. Then hang the bag inside the wastebasket. Leg Bag Never wear the leg bag at night. Always wear the leg bag below your knee. Keep the leg bag secure with a leg strap or tape. How to care for your skin  Clean the skin around the catheter at least once every day.  Shower every day. Do not take baths.  Put creams, lotions, or ointments on your genital area only as told by your doctor.  Do not use powders, sprays, or lotions on your genital area. How to clean your catheter and your skin 1. Wash your hands with soap and water. 2. Wet a washcloth in warm water and gentle (mild) soap. 3. Use the washcloth to clean the skin where the catheter enters your body. Clean downward and wipe away from the catheter in small circles. Do not wipe toward the catheter. 4. Pat the area dry with a clean towel. Make sure to clean off all soap. How to care for your drainage bags Empty your drainage bag when it is ?- full or at least 2-3 times a day. Replace your drainage bag once a month or sooner if it starts to smell bad or look dirty. Do not clean your drainage bag unless told by your doctor. Emptying a drainage bag  Supplies Needed  Rubbing alcohol.  Gauze pad or cotton ball.  Tape or a leg strap.  Steps 1. Wash your hands with soap and water. 2. Separate (detach) the bag from your leg. 3. Hold the bag over  the toilet or a clean container. Keep the bag below your hips and bladder. This stops pee (urine) from going back into the tube. 4. Open the pour spout at the bottom of the bag. 5. Empty the pee into the toilet or container. Do not let the pour spout touch any surface. 6. Put rubbing alcohol on a gauze pad or cotton ball. 7. Use the gauze pad or cotton ball to clean the pour spout. 8. Close the pour spout. 9. Attach the bag to your leg with tape or a leg strap. 10. Wash your hands.  Changing a drainage bag Supplies Needed  Alcohol wipes.  A clean drainage bag.  Adhesive tape or a leg strap.  Steps 1. Wash your hands with soap and water. 2. Separate the dirty bag from your leg. 3. Pinch the rubber catheter with your fingers so that pee does not spill out. 4. Separate the catheter tube from the drainage tube where these tubes connect (at the connection valve). Do not let the tubes touch any surface. 5. Clean the end of the catheter tube with an alcohol wipe. Use a different alcohol wipe to clean the end of the drainage tube. 6. Connect the catheter tube to the drainage tube of the clean bag. 7. Attach the new bag to  the leg with adhesive tape or a leg strap. 8. Wash your hands.  How to prevent infection and other problems  Never pull on your catheter or try to remove it. Pulling can damage tissue in your body.  Always wash your hands before and after touching your catheter.  If a leg strap gets wet, replace it with a dry one.  Drink enough fluids to keep your pee clear or pale yellow, or as told by your doctor.  Do not let the drainage bag or tubing touch the floor.  Wear cotton underwear.  If you are male, wipe from front to back after you poop (have a bowel movement).  Check on the catheter often to make sure it works and the tubing is not twisted. Get help if:  Your pee is cloudy.  Your pee smells unusually bad.  Your pee is not draining into the bag.  Your  tube gets clogged.  Your catheter starts to leak.  Your bladder feels full. Get help right away if:  You have redness, swelling, or pain where the catheter enters your body.  You have fluid, pus, or a bad smell coming from the area where the catheter enters your body.  The area where the catheter enters your body feels warm.  You have a fever.  You have pain in your: ? Stomach (abdomen). ? Legs. ? Lower back. ? Bladder.  You see blood fill the catheter.  Your pee is pink or red.  You feel sick to your stomach (nauseous).  You throw up (vomit).  You have chills.  Your catheter gets pulled out. This information is not intended to replace advice given to you by your health care provider. Make sure you discuss any questions you have with your health care provider. Document Released: 03/30/2013 Document Revised: 10/31/2016 Document Reviewed: 05/18/2014 Elsevier Interactive Patient Education  2018 [place] Anesthesia, Adult, Care After These instructions provide you with information about caring for yourself after your procedure. Your health care provider may also give you more specific instructions. Your treatment has been planned according to current medical practices, but problems sometimes occur. Call your health care provider if you have any problems or questions after your procedure. What can I expect after the procedure? After the procedure, it is common to have:  Vomiting.  A sore throat.  Mental slowness.  It is common to feel:  Nauseous.  Cold or shivery.  Sleepy.  Tired.  Sore or achy, even in parts of your body where you did not have surgery.  Follow these instructions at home: For at least 24 hours after the procedure:  Do not: ? Participate in activities where you could fall or become injured. ? Drive. ? Use heavy machinery. ? Drink alcohol. ? Take sleeping pills or medicines that cause drowsiness. ? Make important  decisions or sign legal documents. ? Take care of children on your own.  Rest. Eating and drinking  If you vomit, drink water, juice, or soup when you can drink without vomiting.  Drink enough fluid to keep your urine clear or pale yellow.  Make sure you have little or no nausea before eating solid foods.  Follow the diet recommended by your health care provider. General instructions  Have a responsible adult stay with you until you are awake and alert.  Return to your normal activities as told by your health care provider. Ask your health care provider what activities are safe for you.  Take  over-the-counter and prescription medicines only as told by your health care provider.  If you smoke, do not smoke without supervision.  Keep all follow-up visits as told by your health care provider. This is important. Contact a health care provider if:  You continue to have nausea or vomiting at home, and medicines are not helpful.  You cannot drink fluids or start eating again.  You cannot urinate after 8-12 hours.  You develop a skin rash.  You have fever.  You have increasing redness at the site of your procedure. Get help right away if:  You have difficulty breathing.  You have chest pain.  You have unexpected bleeding.  You feel that you are having a life-threatening or urgent problem. This information is not intended to replace advice given to you by your health care provider. Make sure you discuss any questions you have with your health care provider. Document Released: 03/11/2001 Document Revised: 05/07/2016 Document Reviewed: 11/17/2015 Elsevier Interactive Patient Education  2018 [employer].  Cystoscopy patient instructions  Following a cystoscopy, a catheter (a flexible rubber tube) is sometimes left in place to empty the bladder. This may cause some discomfort or a feeling that you need to urinate. Your doctor determines the period of time that the catheter  will be left in place. You may have bloody urine for two to three days (Call your doctor if the amount of bleeding increases or does not subside).  You may pass blood clots in your urine, especially if you had a biopsy. It is not unusual to pass small blood clots and have some bloody urine a couple of weeks after your cystoscopy. Again, call your doctor if the bleeding does not subside. You may have: Dysuria (painful urination) Frequency (urinating often) Urgency (strong desire to urinate)  These symptoms are common especially if medicine is instilled into the bladder or a ureteral stent is placed. Avoiding alcohol and caffeine, such as coffee, tea, and chocolate, may help relieve these symptoms. Drink plenty of water, unless otherwise instructed. Your doctor may also prescribe an antibiotic or other medicine to reduce these symptoms.  Cystoscopy results are available soon after the procedure; biopsy results usually take two to four days. Your doctor will discuss the results of your exam with you. Before you go home, you will be given specific instructions for follow-up care. Special Instructions:  1 If you are going home with a catheter in place do not take a tub bath until removed by your doctor.  2 You may resume your normal activities.  3 Do not drive or operate machinery if you are taking narcotic pain medicine.  4 Be sure to keep all follow-up appointments with your doctor.   5 Call Your Doctor If: The catheter is not draining  You have severe pain  You are unable to urinate  You have a fever over 101  You have severe bleeding

## 2017-05-30 NOTE — H&P (Signed)
HPI: Blake Cruz is a 76 year-old male established patient who is here for a urethral stricture.  05/23/2017: He underwent radical prostatectomy in November 2015. Prior to that, he had a transurethral resection and vaporization of his prostate a few months earlier. 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 In between his radical prostatectomy and the current time, he underwent and incision of bladder neck contracture. He still has an indwelling Foley catheter which is changed every 6 weeks.  Since his first visit here, he has had less pain from his bladder. He has been on acetic acid irrigations once a day.   05/30/17: Old catheter was removed. The patient was taught self intermittent catheterization, using a 16 French red rubber catheter with a coude-tip. He adequately passed the catheter without difficulty. Upper position of the tip of the catheter was checked by irrigating with sterile water. Patient empirically treated for a UTI with Macrodantin. He is also given oxybutynin for bladder spasms when necessary. Instructed to perform CIC 3 times a day.   Patient only performed CIC once in the past week. He c/o of sensory unaware incontinence limiting his ability to leave the house. He denies dysuria, gross hematuria, or fevers.   He does have a history of urethral strictures. He is having problems with emptying his bladder well. He has had the symptoms for 2 years.   He has previously had an indwelling catheter in for more than two weeks at a time.     CC: I have prostate cancer (treatment).  HPI: He underwent radical retropubic prostatectomy-in [place], [place] on 10/27/2014. Dr. Comer Cruz was his urologist.   Prior to his radical prostatectomy, he underwent transurethral resection and vaporization of his prostate. That procedure was performed on 06/30/2014. Pathology revealed Gleason 3+3 = 6 cancer involving less than 5% of the submitted prostate tissue.  He subsequently underwent staging evaluation including a CT scan which revealed nonpathologic enlargement of iliac lymph nodes, as well as a 9 mm left renal stone. He was indeterminate lesion within the left kidney likely representing benign hemorrhagic cyst. However, this could not be fully characterized.   Final pathology revealed Gleason 3+4 = 7 pattern. Lymph nodes resected were benign. Because of disruption of the prostate, adequate stage could not be assessed. Neither could the presence of extra prostatic extension or positive margins.  The patient has had persistent bladder neck contracture since that time. Apparently, he had at least one laser ablation of this bladder neck contracture. The patient states that for a short period of time he had been on self-catheterization. However, he has been on long-term Foley catheterization recently. The patient states that he has had multiple urinary tract infections treated, most likely with Invanz. He complains of persistent dysuria/penile pain.     He did have surgery. He had the following treatment for prostate cancer: retropubic prostatectomy. His prostate surgery was done 10/27/2014.   His PSA blood tests have been low since his prostate cancer treatment was started.   He does have problems with erections. He does not have urinary incontinence.   He does have a good appetite. BOWEL HABITS: his bowels are moving normally.     ALLERGIES: No Known Drug Allergies    MEDICATIONS: Macrodantin 100 mg capsule 1 capsule PO Q 12 H  Oxybutynin Chloride 5 mg tablet 1 tablet PO Q 8 H prn bladder spasms bladder spasms  Acetic Acid 0.25 % solution, irrigation 70 ml through catheter Daily  Amlodipine  Besylate-Benazepril 10 mg-20 mg capsule  Aspir 81 81 mg tablet, delayed release  Dexilant 60 mg capsule, delayed release, biphasic  Escitalopram Oxalate 20 mg tablet     GU PSH: None     PSH Notes: Prostatectomy in [place] in 2015.   NON-GU PSH:  None   GU PMH: Acute Cystitis/UTI (Improving), He has been doing better on acetic acid irrigations. - 05/23/2017, He has had multiple positive cultures treated, most recently with Invanz. The patient does have an indwelling Foley catheter, so this clouds situation, as one is expected to have colonization with the catheter., - 04/22/2017 Membranous urethral stricture - 05/23/2017, He has a bladder neck contracture that was easily passed with the 16 French coude-tip catheter today. I question whether he has some bladder dysfunction as well, as he apparently did not void even when his ladder neck contracture had been opened up., - 04/22/2017 Prostate Cancer, Status post radical prostatectomy. We will check PSA today. - 05/23/2017, The patient underwent radical prostatectomy in [place] in November, 2015. Today, he says his PSAs have been0, although the last one was apparently done well over a year ago. Unfortunately, he has been left with significant bladder neck contracture which was probably fairly likely to happen since he had laser prostatectomy shortly before his radical prostatectomy., - 04/22/2017 Renal calculus - 05/23/2017, He had a 9 mm left renal stone that was found incidentally on CT scan, subsequently treated with lithotripsy and, following this, with ureteroscopy., - 04/22/2017 Suprapubic pain (Improving) - 05/23/2017, The patient has a long-term indwelling Foley catheter. I will work at trying to get this out, eventually teach the patient how to do self intermittent catheterization. His pain may well be due to bladder spasms or chronic cystitis., - 04/22/2017      PMH Notes: Hx of kidney stones   NON-GU PMH: Depression GERD Glaucoma Hypertension    FAMILY HISTORY: Death - Father, Mother Diabetes - Runs in Family Hypertension - Father, Mother, Daughter Prostate Cancer - Brother   SOCIAL HISTORY: Marital Status: Married Current Smoking Status: Patient has never smoked.   Tobacco Use Assessment  Completed: Used Tobacco in last 30 days? Does not drink anymore.  Drinks 3 caffeinated drinks per day. Patient's occupation is/was Retired.    REVIEW OF SYSTEMS:    GU Review Male:   Patient reports hard to postpone urination and leakage of urine. Patient denies frequent urination, burning/ pain with urination, get up at night to urinate, stream starts and stops, trouble starting your stream, have to strain to urinate , erection problems, and penile pain.  Gastrointestinal (Upper):   Patient denies nausea, vomiting, and indigestion/ heartburn.  Gastrointestinal (Lower):   Patient denies diarrhea and constipation.  Constitutional:   Patient denies fever, night sweats, weight loss, and fatigue.  Skin:   Patient denies skin rash/ lesion and itching.  Eyes:   Patient denies blurred vision and double vision.  Ears/ Nose/ Throat:   Patient denies sore throat and sinus problems.  Hematologic/Lymphatic:   Patient denies easy bruising and swollen glands.  Cardiovascular:   Patient denies leg swelling and chest pains.  Respiratory:   Patient denies cough and shortness of breath.  Endocrine:   Patient denies excessive thirst.  Musculoskeletal:   Patient denies back pain and joint pain.  Neurological:   Patient denies headaches and dizziness.  Psychologic:   Patient denies depression and anxiety.   VITAL SIGNS:      05/30/2017 09:18 AM  BP 135/71 mmHg  Pulse 65 /min  Temperature 99.4 F / 37 C   MULTI-SYSTEM PHYSICAL EXAMINATION:    Constitutional: Well-nourished. No physical deformities. Normally developed. Good grooming.  Neck: Neck symmetrical, not swollen. Normal tracheal position.  Respiratory: No labored breathing, no use of accessory muscles.   Cardiovascular: Normal temperature, normal extremity pulses, no swelling, no varicosities.  Neurologic / Psychiatric: Oriented to time, oriented to place, oriented to person. No depression, no anxiety, no agitation.  Gastrointestinal: Obese  abdomen. Midline suprapubic scar. No mass, no tenderness, no rigidity.      PAST DATA REVIEWED:  Source Of History:  Patient, Family/Caregiver  Records Review:   Previous Patient Records  Urodynamics Review:   Review Bladder Scan   05/23/17  PSA  Total PSA < 0.02 ng/dl    PROCEDURES:           PVR Ultrasound - 54627  Scanned Volume: 648 cc   Cysto Uretheral Dilation - 03500  Risks, benefits, and some of the potential complications of the procedure were discussed at length with the patient including infection, bleeding, voiding discomfort, urinary retention, fever, chills, sepsis, and others. All questions were answered. Informed consent was obtained. Sterile technique and 2% Lidocaine intraurethral analgesia were used.  Meatus:  Normal size. Normal location. Normal condition.  Urethra:  No strictures.  External Sphincter:  Normal.  Verumontanum:  Normal.  Bladder Neck:  Severe bladder neck contracture.Urethra was noted to be entirely normal down to a pinpoint area that was surrounded with scar. I was able to pass a 0.038 inch floppy tip guidewire through this and then used the [facility] dilators starting at 38 French but was unable to pass the dilator through the very dense stricture. I therefore left the guidewire in place and passed a filiform into the bladder. I attempted to pass followers starting at 59 French but this was unsuccessful. My thought was that because it was thin and somewhat flexible A more sturdy follower might be able to negotiate its way through the dense stricture and tried 14, 16 and 45 [place] followers but was unsuccessful. One more attempt at passing a Hayman dilator was again unsuccessful.             Ceftriaxone 1g - [id number], [id number] Qty: 1 Adm. By: Alleen Borne McDougald  Unit: gram Lot No [id number]  Route: IM Exp. Date 10/18/2019  Freq: None Mfgr.:   Site: Right Buttock   ASSESSMENT/PLAN:   He has a very severe bladder neck contracture. A small amount of urine did  return and it appeared clear but somewhat cloudy and malodorous. He had been on nitrofurantoin but I had him receive 1 g of Rocephin. Because he was in retention and I felt placement of a suprapubic tube would be a good option however having had previous radical prostatectomy through a lower midline incision this could not be performed safely in the office and therefore interventional radiology was contacted to aid in placement of a suprapubic tube however they indicated they could not perform this procedure today. Because he could not undergo suprapubic tube placement by interventional radiology today I felt he needed to be taken to the operating room to manage his bladder neck contracture. I will perform cystoscopy and I will attempt to dilate his bladder neck contracture. If I'm unsuccessful I may try to incise the bladder neck contracture with the laser and if all else fails I will place an open suprapubic tube.

## 2017-05-30 NOTE — Anesthesia Preprocedure Evaluation (Addendum)
Anesthesia Evaluation  Patient identified by MRN, date of birth, ID band Patient awake    Reviewed: Allergy & Precautions, NPO status , Patient's Chart, lab work & pertinent test results  Airway Mallampati: III  TM Distance: >3 FB Neck ROM: Full    Dental  (+) Dental Advisory Given, Edentulous Upper, Edentulous Lower   Pulmonary neg pulmonary ROS,    Pulmonary exam normal breath sounds clear to auscultation       Cardiovascular hypertension, Pt. on medications Normal cardiovascular exam Rhythm:Regular Rate:Normal     Neuro/Psych negative neurological ROS     GI/Hepatic Neg liver ROS, GERD  Medicated and Controlled,  Endo/Other  negative endocrine ROS  Renal/GU Renal InsufficiencyRenal disease   Prostate cancer    Musculoskeletal negative musculoskeletal ROS (+)   Abdominal   Peds  Hematology  (+) Blood dyscrasia, anemia ,   Anesthesia Other Findings Day of surgery medications reviewed with the patient.  Reproductive/Obstetrics                            Anesthesia Physical Anesthesia Plan  ASA: III  Anesthesia Plan: General   Post-op Pain Management:    Induction: Intravenous  PONV Risk Score and Plan: 3 and Ondansetron, Dexamethasone, Propofol and Treatment may vary due to age or medical condition  Airway Management Planned: LMA  Additional Equipment:   Intra-op Plan:   Post-operative Plan: Extubation in OR  Informed Consent: I have reviewed the patients History and Physical, chart, labs and discussed the procedure including the risks, benefits and alternatives for the proposed anesthesia with the patient or authorized representative who has indicated his/her understanding and acceptance.   Dental advisory given  Plan Discussed with: CRNA  Anesthesia Plan Comments: (Risks/benefits of general anesthesia discussed with patient including risk of damage to teeth, lips, gum,  and tongue, nausea/vomiting, allergic reactions to medications, and the possibility of heart attack, stroke and death.  All patient questions answered.  Patient wishes to proceed.)        Anesthesia Quick Evaluation

## 2017-05-30 NOTE — ED Notes (Signed)
Per Blanche East, received phone cal that patient will be going to surgery and OR will be calling soon for report.

## 2017-05-30 NOTE — Op Note (Signed)
PATIENT:  Blake Cruz  PRE-OPERATIVE DIAGNOSIS: Severe bladder neck contracture  POST-OPERATIVE DIAGNOSIS: Same  PROCEDURE: 1. Cystoscopy with balloon dilatation of bladder neck contracture. 2. Laser incision of bladder neck contracture. 3. Cystogram. 4. Difficult Foley catheter placement. 5. Fluoroscopy time less than 1 hour.  SURGEON:  Claybon Jabs  INDICATION: Blake Cruz is a 76 year old male with a history of a bladder neck contracture. He presented to the office earlier today with urinary retention. Cystoscopy at that time revealed a pinpoint bladder neck contracture that I was able to get a guidewire across but was unable to dilate this using either the [facility] dilators or using filiforms and followers. He had had a previous lower abdominal incision with scarring at the lower extent of this incision placing him at a risk for blind suprapubic tube placement. I asked interventional radiology if they would place the suprapubic tube but they were unable to accommodate the patient today. He is having discomfort so I therefore have brought him to the operating room for management of his bladder neck contracture under anesthesia. We have discussed the procedure in detail. He received 1 g of Rocephin in my office.  ANESTHESIA:  General  EBL:  Minimal  DRAINS: 20 [place] tip catheter  LOCAL MEDICATIONS USED:  None  SPECIMEN:  Urine for culture and sensitivity  Description of procedure: After informed consent the patient was taken to the operating room and placed on the table in a supine position. General anesthesia was then administered. Once fully anesthetized the patient was moved to the dorsal lithotomy position and the genitalia were sterilely prepped and draped in standard fashion. An official timeout was then performed.  The 23 French cystoscope was advanced down the urethra and I was able to identify the bladder neck contracture. A 6 French open-ended ureteral catheter  was then passed through the cystoscope and I used this to guide a guidewire which was passed through this through the area bladder neck contracture and into the bladder. This was confirmed by fluoroscopy. The guidewire was left in place and I then passed the UroMax nephrostomy dilating balloon over the guidewire, through the cystoscope and across the bladder neck contracture. I then removed the guidewire and injected full-strength Omnipaque contrast through the dilating balloon after I had obtained urine for culture and sensitivity.  His cystogram revealed that the balloon was located in the bladder and in good position. It was therefore inflated to 16 atm and then deflated and removed after the guidewire had been reinserted. With the guidewire in place and repeated cystoscopy and noted his bladder neck was open to some degree however I felt further incision of the bladder neck was indicated and therefore chose a 550  holmium laser fiber with a setting of 2 J and a rate of 15 and used this to incise the bladder neck at the 9:00, 3:00 and 12:00 positions. This opened the bladder neck up.  With the guidewire still in place I removed the cystoscope and passed a 20 [place] council tip catheter over the guidewire and with some manipulation was able to advance this into the bladder with clear irrigant returning. The catheter balloon was then filled with 10 mL of sterile water and the catheter was connected to closed system drainage. The patient was awakened and taken to recovery room in stable and satisfactory condition. He tolerated procedure well and there were no intraoperative complications.  PLAN OF CARE: Discharge to home after PACU  PATIENT DISPOSITION:  PACU -  hemodynamically stable.

## 2017-05-30 NOTE — ED Provider Notes (Signed)
Emergency Department Provider Note   I have reviewed the triage vital signs and the nursing notes.   HISTORY  Chief Complaint sent from Urology office for IR to place s/p tube   HPI Blake Cruz is a 76 y.o. male with past medical history of prostate cancer presents to the emergency department for evaluation of urinary retention. He was seen in the urology office this morning where bladder catheterization was attempted multiple times but was unsuccessful. He was referred to the emergency department for evaluation and operative intervention for bladder catheterization. Patient reports his last urine output as 8 AM this morning and states it was "coke colored." No fevers or chills. No significant abdominal or chest discomfort at this time. No radiation of symptoms.   Past Medical History:  Diagnosis Date  . Cancer [facility])    prostate  . GERD (gastroesophageal reflux disease)   . Glaucoma   . Hypertension   . Kidney stone     There are no active problems to display for this patient.   Past Surgical History:  Procedure Laterality Date  . PROSTATECTOMY      Current Outpatient Rx  . Order #: [id number] Class: Historical Med  . Order #: [id number] Class: Historical Med  . Order #: [id number] Class: Historical Med  . Order #: [id number] Class: Historical Med  . Order #: [id number] Class: Historical Med  . Order #: [id number] Class: Print  . Order #: [id number] Class: Print    Allergies Patient has no known allergies.  No family history on file.  Social History Social History  Substance Use Topics  . Smoking status: Never Smoker  . Smokeless tobacco: Never Used  . Alcohol use No     Comment: former     Review of Systems  Constitutional: No fever/chills Eyes: No visual changes. ENT: No sore throat. Cardiovascular: Denies chest pain. Respiratory: Denies shortness of breath. Gastrointestinal: Positive lower abdominal pain.  No nausea, no vomiting.  No diarrhea.  No  constipation. Genitourinary: Positive urinary retention.  Musculoskeletal: Negative for back pain. Skin: Negative for rash. Neurological: Negative for headaches, focal weakness or numbness.  10-point ROS otherwise negative.  ____________________________________________   PHYSICAL EXAM:  VITAL SIGNS: ED Triage Vitals  Enc Vitals Group     BP 05/30/17 1219 (!) 150/82     Pulse Rate 05/30/17 1219 82     Resp 05/30/17 1219 18     Temp 05/30/17 1219 99.6 F (37.6 C)     Temp Source 05/30/17 1219 Oral     SpO2 05/30/17 1219 95 %     Pain Score 05/30/17 1220 10   Constitutional: Alert and oriented. Well appearing and in no acute distress. Eyes: Conjunctivae are normal. Head: Atraumatic. Nose: No congestion/rhinnorhea. Mouth/Throat: Mucous membranes are moist. Neck: No stridor.  Cardiovascular: Normal rate, regular rhythm. Good peripheral circulation. Grossly normal heart sounds.   Respiratory: Normal respiratory effort.  No retractions. Lungs CTAB. Gastrointestinal: Soft with mild lower abdominal tenderness and fullness. No distention.  Musculoskeletal: No lower extremity tenderness nor edema. No gross deformities of extremities. Neurologic:  Normal speech and language. No gross focal neurologic deficits are appreciated.  Skin:  Skin is warm, dry and intact. No rash noted.  ____________________________________________   LABS (all labs ordered are listed, but only abnormal results are displayed)  Labs Reviewed  COMPREHENSIVE METABOLIC PANEL - Abnormal; Notable for the following:       Result Value   Potassium 2.9 (*)    Glucose, Bld 110 (*)    Creatinine,  Ser 1.42 (*)    Calcium 8.0 (*)    Albumin 3.2 (*)    ALT 11 (*)    GFR calc non Af Amer 47 (*)    GFR calc Af Amer 55 (*)    All other components within normal limits  CBC WITH DIFFERENTIAL/PLATELET - Abnormal; Notable for the following:    WBC 12.7 (*)    RBC 3.60 (*)    Hemoglobin 9.5 (*)    HCT 30.2 (*)     Neutro Abs 12.1 (*)    Lymphs Abs 0.3 (*)    All other components within normal limits  URINE CULTURE  URINALYSIS, ROUTINE W REFLEX MICROSCOPIC   ____________________________________________   PROCEDURES  Procedure(s) performed:   Procedures  None ____________________________________________   INITIAL IMPRESSION / ASSESSMENT AND PLAN / ED COURSE  Pertinent labs & imaging results that were available during my care of the patient were reviewed by me and considered in my medical decision making (see chart for details).  Patient presents to the emergency pertinent for evaluation of urinary retention with the outpatient urologist unable to catheterize the bladder. Urology has seen the patient on arrival with plan to take him to the OR. Patient is in no acute distress. I have ordered baseline lab work and will reassess.  03:02 PM Patient having some lower abdominal discomfort. Awaiting OR call for transport. Have ordered morphine for pain.  Care transferred to Urology service.  I reviewed all nursing notes, vitals, pertinent old records, EKGs, labs, imaging (as available).  ____________________________________________  FINAL CLINICAL IMPRESSION(S) / ED DIAGNOSES  Final diagnoses:  Bladder neck contracture     MEDICATIONS GIVEN DURING THIS VISIT:  Medications  lactated ringers infusion ( Intravenous Stopped 05/30/17 1830)  fentaNYL (SUBLIMAZE) injection 25-50 mcg (not administered)  ondansetron (ZOFRAN) injection 4 mg (not administered)  ciprofloxacin (CIPRO) IVPB 400 mg (not administered)  morphine 2 MG/ML injection 4 mg (4 mg Intravenous Given 05/30/17 1509)     NEW OUTPATIENT MEDICATIONS STARTED DURING THIS VISIT:  Discharge Medication List as of 05/30/2017  5:51 PM    START taking these medications   Details  ciprofloxacin (CIPRO) 500 MG tablet Take 1 tablet (500 mg total) by mouth 2 (two) times daily., Starting Thu 05/30/2017, Print    traMADol (ULTRAM) 50 MG  tablet Take 1 tablet (50 mg total) by mouth every 6 (six) hours as needed., Starting Thu 05/30/2017, Print          Note:  This document was prepared using Dragon voice recognition software and may include unintentional dictation errors.  Nanda Quinton, MD Emergency Medicine   Long, [facility], MD 05/30/17 [phone]

## 2017-05-30 NOTE — ED Triage Notes (Signed)
Patient sent from [facility] office for IR to place s/p tube due to patient having urinary incontinence and insisting on foley cath to be placed.  Dr Karsten Ro unable to place catheter and performed cystoscopy, and suspect patient to bladder neck contracture.  Patient c/o severe bladder pain.

## 2017-05-30 NOTE — Anesthesia Procedure Notes (Signed)
Procedure Name: LMA Insertion Date/Time: 05/30/2017 3:45 PM Performed by: Claudia Desanctis Pre-anesthesia Checklist: Emergency Drugs available, Patient identified, Suction available and Patient being monitored Patient Re-evaluated:Patient Re-evaluated prior to inductionOxygen Delivery Method: Circle system utilized Preoxygenation: Pre-oxygenation with 100% oxygen Intubation Type: IV induction Ventilation: Mask ventilation without difficulty LMA: LMA inserted LMA Size: 5.0 Number of attempts: 1 Placement Confirmation: positive ETCO2 and breath sounds checked- equal and bilateral Tube secured with: Tape Dental Injury: Teeth and Oropharynx as per pre-operative assessment  Comments: 4 lma inserted first with inadequate tidal volume achieved.   Removed, mask ventilated and 5 lma inserted without difficulty

## 2017-05-31 ENCOUNTER — Encounter (HOSPITAL_COMMUNITY): Payer: Self-pay | Admitting: Urology

## 2017-06-01 LAB — URINE CULTURE: Culture: >=100000 — AB

## 2017-06-06 DIAGNOSIS — C61 Malignant neoplasm of prostate: Secondary | ICD-10-CM | POA: Diagnosis not present

## 2017-06-06 DIAGNOSIS — N35012 Post-traumatic membranous urethral stricture: Secondary | ICD-10-CM | POA: Diagnosis not present

## 2017-06-06 DIAGNOSIS — N3000 Acute cystitis without hematuria: Secondary | ICD-10-CM | POA: Diagnosis not present

## 2017-06-14 ENCOUNTER — Other Ambulatory Visit: Payer: Self-pay | Admitting: Urology

## 2017-06-14 DIAGNOSIS — N35913 Unspecified membranous urethral stricture, male: Secondary | ICD-10-CM

## 2017-06-14 DIAGNOSIS — R338 Other retention of urine: Secondary | ICD-10-CM

## 2017-06-18 ENCOUNTER — Other Ambulatory Visit: Payer: Self-pay | Admitting: General Surgery

## 2017-06-20 ENCOUNTER — Ambulatory Visit (HOSPITAL_COMMUNITY)
Admission: RE | Admit: 2017-06-20 | Discharge: 2017-06-20 | Disposition: A | Discharge status: Home / Self Care | Payer: Medicare Other | Source: Ambulatory Visit | Attending: Urology | Admitting: Urology

## 2017-06-20 ENCOUNTER — Encounter (HOSPITAL_COMMUNITY): Payer: Self-pay

## 2017-06-20 ENCOUNTER — Other Ambulatory Visit: Payer: Self-pay | Admitting: Urology

## 2017-06-20 DIAGNOSIS — Z7982 Long term (current) use of aspirin: Secondary | ICD-10-CM | POA: Insufficient documentation

## 2017-06-20 DIAGNOSIS — R338 Other retention of urine: Secondary | ICD-10-CM

## 2017-06-20 DIAGNOSIS — N320 Bladder-neck obstruction: Secondary | ICD-10-CM | POA: Diagnosis not present

## 2017-06-20 DIAGNOSIS — Z87442 Personal history of urinary calculi: Secondary | ICD-10-CM | POA: Insufficient documentation

## 2017-06-20 DIAGNOSIS — I10 Essential (primary) hypertension: Secondary | ICD-10-CM | POA: Diagnosis not present

## 2017-06-20 DIAGNOSIS — K219 Gastro-esophageal reflux disease without esophagitis: Secondary | ICD-10-CM | POA: Diagnosis not present

## 2017-06-20 DIAGNOSIS — N359 Urethral stricture, unspecified: Secondary | ICD-10-CM | POA: Diagnosis not present

## 2017-06-20 DIAGNOSIS — Z8546 Personal history of malignant neoplasm of prostate: Secondary | ICD-10-CM | POA: Diagnosis not present

## 2017-06-20 DIAGNOSIS — H409 Unspecified glaucoma: Secondary | ICD-10-CM | POA: Diagnosis not present

## 2017-06-20 DIAGNOSIS — N35913 Unspecified membranous urethral stricture, male: Secondary | ICD-10-CM

## 2017-06-20 LAB — BASIC METABOLIC PANEL
ANION GAP: 10 (ref 5–15)
BUN: 14 mg/dL (ref 6–20)
CALCIUM: 9.2 mg/dL (ref 8.9–10.3)
CO2: 24 mmol/L (ref 22–32)
Chloride: 107 mmol/L (ref 101–111)
Creatinine, Ser: 1.17 mg/dL (ref 0.61–1.24)
GFR calc Af Amer: >60 mL/min (ref >60)
GFR, EST NON AFRICAN AMERICAN: 60 mL/min — AB (ref >60)
Glucose, Bld: 110 mg/dL — ABNORMAL HIGH (ref 65–99)
POTASSIUM: 3.8 mmol/L (ref 3.5–5.1)
SODIUM: 141 mmol/L (ref 135–145)

## 2017-06-20 LAB — CBC
HEMATOCRIT: 36 % — AB (ref 39.0–52.0)
Hemoglobin: 11.5 g/dL — ABNORMAL LOW (ref 13.0–17.0)
MCH: 26.4 pg (ref 26.0–34.0)
MCHC: 31.9 g/dL (ref 30.0–36.0)
MCV: 82.6 fL (ref 78.0–100.0)
Platelets: 212 10*3/uL (ref 150–400)
RBC: 4.36 MIL/uL (ref 4.22–5.81)
RDW: 14.6 % (ref 11.5–15.5)
WBC: 5.7 10*3/uL (ref 4.0–10.5)

## 2017-06-20 LAB — PROTIME-INR
INR: 0.96
PROTHROMBIN TIME: 12.8 s (ref 11.4–15.2)

## 2017-06-20 MED ORDER — MIDAZOLAM HCL 2 MG/2ML IJ SOLN
INTRAMUSCULAR | Status: AC
  Filled 2017-06-20: qty 4

## 2017-06-20 MED ORDER — FENTANYL CITRATE (PF) 100 MCG/2ML IJ SOLN
INTRAMUSCULAR | Status: AC | PRN
  Administered 2017-06-20 (×2): 50 ug via INTRAVENOUS

## 2017-06-20 MED ORDER — SODIUM CHLORIDE 0.9 % IV SOLN
INTRAVENOUS | Status: DC
  Administered 2017-06-20: 08:00:00 via INTRAVENOUS

## 2017-06-20 MED ORDER — HYDROCODONE-ACETAMINOPHEN 5-325 MG PO TABS: 1.0000 | ORAL_TABLET | ORAL | Status: DC | PRN

## 2017-06-20 MED ORDER — LIDOCAINE HCL 1 % IJ SOLN
INTRAMUSCULAR | Status: AC | PRN
  Administered 2017-06-20: 10 mL

## 2017-06-20 MED ORDER — SODIUM CHLORIDE 0.9 % IV SOLN: 500.0000 mg | Freq: Three times a day (TID) | INTRAVENOUS | Status: DC

## 2017-06-20 MED ORDER — MIDAZOLAM HCL 2 MG/2ML IJ SOLN
INTRAMUSCULAR | Status: AC | PRN
  Administered 2017-06-20 (×2): 1 mg via INTRAVENOUS

## 2017-06-20 MED ORDER — FENTANYL CITRATE (PF) 100 MCG/2ML IJ SOLN
INTRAMUSCULAR | Status: AC
  Filled 2017-06-20: qty 4

## 2017-06-20 MED ORDER — SODIUM CHLORIDE 0.9 % IV SOLN
500.0000 mg | INTRAVENOUS | Status: AC
  Administered 2017-06-20: 500 mg via INTRAVENOUS
  Filled 2017-06-20: qty 0.5

## 2017-06-20 MED ORDER — CEFAZOLIN SODIUM-DEXTROSE 2-4 GM/100ML-% IV SOLN: 2.0000 g | INTRAVENOUS | Status: DC

## 2017-06-20 NOTE — Consult Note (Signed)
Chief Complaint: Patient was seen in consultation today for suprapubic catheter placement  Referring Physician(s): Pedro Bay  Supervising Physician: Jacqulynn Cadet  Patient Status: [facility] - Out-pt  History of Present Illness: Blake Cruz is a 76 y.o. male with history of prostate cancer 2015, status post prostatectomy. 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 He presents today for suprapubic catheter placement.   Past Medical History:  Diagnosis Date  . Cancer [facility])    prostate  . GERD (gastroesophageal reflux disease)   . Glaucoma   . Hypertension   . Kidney stone     Past Surgical History:  Procedure Laterality Date  . CYSTOSCOPY/RETROGRADE/URETEROSCOPY/STONE EXTRACTION WITH BASKET N/A 05/30/2017   Procedure: CYSTOSCOPY WITH BALLOON DILATION OF BLADDER NECK CONRACTURE, LASER, INCISION OF BLADDER NECK, CONTRACTURE, DIFFICULT FOLEY PLACEMENT;  Surgeon: Kathie Rhodes, MD;  Location: [facility];  Service: Urology;  Laterality: N/A;  . PROSTATECTOMY      Allergies: Patient has no known allergies.  Medications: Prior to Admission medications   Medication Sig Start Date End Date Taking? Authorizing Provider  amLODipine-benazepril (LOTREL) 10-20 MG capsule Take 1 capsule by mouth daily. 04/20/17  Yes [provider]  aspirin EC 81 MG tablet Take 81 mg by mouth daily.   Yes [provider]  DEXILANT 60 MG capsule Take 60 mg by mouth daily. 05/17/17  Yes [provider]  escitalopram (LEXAPRO) 20 MG tablet Take 20 mg by mouth daily. 05/17/17  Yes [provider]  ciprofloxacin (CIPRO) 500 MG tablet Take 1 tablet (500 mg total) by mouth 2 (two) times daily. 05/30/17   Kathie Rhodes, MD  oxybutynin (DITROPAN) 5 MG tablet Take 5 mg by mouth every 8 (eight) hours as needed for bladder spasms.    [provider]  traMADol (ULTRAM) 50 MG  tablet Take 1 tablet (50 mg total) by mouth every 6 (six) hours as needed. 05/30/17   Kathie Rhodes, MD     History reviewed. No pertinent family history.  Social History   Social History  . Marital status: Married    Spouse name: N/A  . Number of children: N/A  . Years of education: N/A   Social History Main Topics  . Smoking status: Never Smoker  . Smokeless tobacco: Never Used  . Alcohol use No     Comment: former   . Drug use: Unknown  . Sexual activity: Not Asked   Other Topics Concern  . None   Social History Narrative  . None      Review of Systems denies fever, headache, chest pain, dyspnea, cough, abdominal/back pain, nausea, vomiting or abnormal bleeding. He is hard of hearing.  Vital Signs: BP (!) 140/95 (BP Location: Right Arm)   Pulse 70   Temp 98.4 F (36.9 C) (Oral)   Resp 18   Wt 241 lb (109.3 kg)   SpO2 99%   Physical Exam awake, alert. Chest clear to auscultation bilaterally. Heart with regular rate and rhythm. Abdomen soft, positive bowel sounds, nontender. Extremities with full range of motion, Foley catheter in place.  Mallampati Score:     Imaging: Dg C-arm 1-60 Min-no Report  Result Date: 05/30/2017 Fluoroscopy was utilized by the requesting physician.  No radiographic interpretation.    Labs:  CBC:  Recent Labs  05/30/17 1305 06/20/17 0814  WBC 12.7* 5.7  HGB 9.5* 11.5*  HCT 30.2* 36.0*  PLT 191 212  COAGS: No results for input(s): INR, APTT in the last 8760 hours.  BMP:  Recent Labs  05/30/17 1305  NA 144  K 2.9*  CL 111  CO2 22  GLUCOSE 110*  BUN 14  CALCIUM 8.0*  CREATININE 1.42*  GFRNONAA 47*  GFRAA 55*    LIVER FUNCTION TESTS:  Recent Labs  05/30/17 1305  BILITOT 0.5  AST 16  ALT 11*  ALKPHOS 62  PROT 7.0  ALBUMIN 3.2*    TUMOR MARKERS: No results for input(s): AFPTM, CEA, CA199, CHROMGRNA in the last 8760 hours.  Assessment and Plan: 76 y.o. male with history of prostate cancer 2015,  status post prostatectomy. 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 He presents today for suprapubic catheter placement. Details/risks of procedure, including but not limited to, internal bleeding, infection, injury to adjacent structures, discussed with patient and family with their understanding and consent.   Thank you for this interesting consult.  I greatly enjoyed meeting Blake Cruz and look forward to participating in their care.  A copy of this report was sent to the requesting provider on this date.  Electronically Signed: D. Rowe Robert, PA-C 06/20/2017, 8:43 AM   I spent a total of  25 minutes   in face to face in clinical consultation, greater than 50% of which was counseling/coordinating care for suprapubic catheter placement

## 2017-06-20 NOTE — Procedures (Signed)
Interventional Radiology Procedure Note  Procedure: Placement of a 23F suprapubic catheter with CT guidance.   Complications: none  Estimated Blood Loss: none  Recommendations: - To bag drainage - Will check with urology if foley can now be removed  Signed,  Criselda Peaches, MD

## 2017-06-20 NOTE — Discharge Instructions (Signed)
Suprapubic Catheter Replacement, Care After Refer to this sheet in the next few weeks. These instructions provide you with information about caring for yourself after your procedure. Your health care provider may also give you more specific instructions. Your treatment has been planned according to current medical practices, but problems sometimes occur. Call your health care provider if you have any problems or questions after your procedure. What can I expect after the procedure? After your procedure, it is possible to have some discomfort around the opening in your abdomen. Follow these instructions at home: Caring for your skin around the catheter Use a clean washcloth and soapy water to clean the skin around your catheter every day. Pat the area dry with a clean towel.  Do not pull on the catheter.  Do not use ointment or lotion on this area unless told by your health care provider.  Check your skin around the catheter every day for signs of infection. Check for: ? Redness, swelling, or pain. ? Fluid or blood. ? Warmth. ? Pus or a bad smell.  Caring for the catheter tube  Clean the catheter tube with soap and water as often as told by your health care provider.  Always make sure there are no twists or curls (kinks) in the catheter tube. Emptying the collection bag Empty the large collection bag every 8 hours. Empty the small collection bag when it is about ? full. To empty your large or small collection bag, take the following steps:  Always keep the bag below the level of the catheter. This keeps urine from flowing backwards into the catheter.  Hold the bag over the toilet or another container. Turn the valve (spigot) at the bottom of the bag to empty the urine. ? Do not touch the opening of the spigot. ? Do not let the opening touch the toilet or container.  Close the spigot tightly when the bag is empty.  Cleaning the collection bag  Clean the collection bag every 2-3  days, or as often as told by your health care provider. To do this, take the following steps:  Wash your hands with soap and water. If soap and water are not available, use hand sanitizer.  Disconnect the bag from the catheter and immediately attach a new bag to the catheter.  Empty the used bag completely.  Clean the used bag using one of the following methods: ? Rinse the bag with warm water and soap. ? Fill the bag with water and add 1 tsp of vinegar. Let it sit for about 30 minutes, then empty the bag.  Let the bag dry completely, and put it in a clean plastic bag before storing it.  General instructions  Always wash your hands before and after caring for your catheter and collection bag. Use a mild, fragrance-free soap. If soap and water are not available, use hand sanitizer.  Always make sure there are no leaks in the catheter or collection bag.  Drink enough fluid to keep your urine clear or pale yellow.  If you were prescribed an antibiotic medicine, take it as told by your health care provider. Do not stop taking the antibiotic even if you start to feel better.  Do not take baths, swim, or use a hot tub.  Keep all follow-up appointments as told by your health care provider. This is important. Contact a health care provider if:  You leak urine.  You have redness, swelling, or pain around your catheter opening.  You have  fluid or blood coming from your catheter opening. °· Your catheter opening feels warm to the touch. °· You have pus or a bad smell coming from your catheter opening. °· You have a fever or chills. °· Your urine flow slows down. °· Your urine becomes cloudy or smelly. °Get help right away if: °· Your catheter comes out. °· You feel nauseous. °· You have back pain. °· You have difficulty changing your catheter. °· You have blood in your urine. °· You have no urine flow for 1 hour. °This information is not intended to replace advice given to you by your health  care provider. Make sure you discuss any questions you have with your health care provider. °Document Released: 08/21/2011 Document Revised: 08/01/2016 Document Reviewed: 08/16/2015 °Elsevier Interactive Patient Education © 2018 [employer] ° ° °Moderate Conscious Sedation, Adult, Care After °These instructions provide you with information about caring for yourself after your procedure. Your health care provider may also give you more specific instructions. Your treatment has been planned according to current medical practices, but problems sometimes occur. Call your health care provider if you have any problems or questions after your procedure. °What can I expect after the procedure? °After your procedure, it is common: °· To feel sleepy for several hours. °· To feel clumsy and have poor balance for several hours. °· To have poor judgment for several hours. °· To vomit if you eat too soon. ° °Follow these instructions at home: °For at least 24 hours after the procedure: ° °· Do not: °? Participate in activities where you could fall or become injured. °? Drive. °? Use heavy machinery. °? Drink alcohol. °? Take sleeping pills or medicines that cause drowsiness. °? Make important decisions or sign legal documents. °? Take care of children on your own. °· Rest. °Eating and drinking °· Follow the diet recommended by your health care provider. °· If you vomit: °? Drink water, juice, or soup when you can drink without vomiting. °? Make sure you have little or no nausea before eating solid foods. °General instructions °· Have a responsible adult stay with you until you are awake and alert. °· Take over-the-counter and prescription medicines only as told by your health care provider. °· If you smoke, do not smoke without supervision. °· Keep all follow-up visits as told by your health care provider. This is important. °Contact a health care provider if: °· You keep feeling nauseous or you keep vomiting. °· You feel  light-headed. °· You develop a rash. °· You have a fever. °Get help right away if: °· You have trouble breathing. °This information is not intended to replace advice given to you by your health care provider. Make sure you discuss any questions you have with your health care provider. °Document Released: 09/23/2013 Document Revised: 05/07/2016 Document Reviewed: 03/24/2016 °Elsevier Interactive Patient Education © 2018 [employer] Inc[employer] ° ° °

## 2017-06-20 NOTE — Progress Notes (Signed)
Foley catheter removed per order, suprapubic catheter intact, draining, secured to leg.

## 2017-06-25 ENCOUNTER — Other Ambulatory Visit (HOSPITAL_COMMUNITY): Payer: Self-pay | Admitting: Interventional Radiology

## 2017-06-25 DIAGNOSIS — N135 Crossing vessel and stricture of ureter without hydronephrosis: Secondary | ICD-10-CM

## 2017-06-25 DIAGNOSIS — N320 Bladder-neck obstruction: Secondary | ICD-10-CM

## 2017-07-25 ENCOUNTER — Ambulatory Visit (HOSPITAL_COMMUNITY)
Admission: RE | Admit: 2017-07-25 | Discharge: 2017-07-25 | Disposition: A | Discharge status: Home / Self Care | Payer: Medicare Other | Source: Ambulatory Visit | Attending: Interventional Radiology | Admitting: Interventional Radiology

## 2017-07-25 ENCOUNTER — Encounter (HOSPITAL_COMMUNITY): Payer: Self-pay

## 2017-07-25 DIAGNOSIS — Z79899 Other long term (current) drug therapy: Secondary | ICD-10-CM | POA: Insufficient documentation

## 2017-07-25 DIAGNOSIS — I10 Essential (primary) hypertension: Secondary | ICD-10-CM | POA: Diagnosis not present

## 2017-07-25 DIAGNOSIS — Z466 Encounter for fitting and adjustment of urinary device: Secondary | ICD-10-CM | POA: Insufficient documentation

## 2017-07-25 DIAGNOSIS — N135 Crossing vessel and stricture of ureter without hydronephrosis: Secondary | ICD-10-CM

## 2017-07-25 DIAGNOSIS — Z8546 Personal history of malignant neoplasm of prostate: Secondary | ICD-10-CM | POA: Insufficient documentation

## 2017-07-25 DIAGNOSIS — N359 Urethral stricture, unspecified: Secondary | ICD-10-CM | POA: Diagnosis not present

## 2017-07-25 DIAGNOSIS — N320 Bladder-neck obstruction: Secondary | ICD-10-CM | POA: Diagnosis not present

## 2017-07-25 DIAGNOSIS — Z7982 Long term (current) use of aspirin: Secondary | ICD-10-CM | POA: Diagnosis not present

## 2017-07-25 DIAGNOSIS — Z435 Encounter for attention to cystostomy: Secondary | ICD-10-CM | POA: Diagnosis not present

## 2017-07-25 HISTORY — DX: Other specified postprocedural states: Z98.890

## 2017-07-25 HISTORY — DX: Urinary tract infection, site not specified: N39.0

## 2017-07-25 HISTORY — DX: Nausea with vomiting, unspecified: R11.2

## 2017-07-25 HISTORY — PX: IR CATHETER TUBE CHANGE: IMG717

## 2017-07-25 HISTORY — DX: Other cystostomy status: Z93.59

## 2017-07-25 MED ORDER — ONDANSETRON HCL 4 MG/2ML IJ SOLN
4.0000 mg | Freq: Once | INTRAMUSCULAR | Status: AC
  Administered 2017-07-25: 4 mg via INTRAVENOUS
  Filled 2017-07-25: qty 2

## 2017-07-25 MED ORDER — SODIUM CHLORIDE 0.9 % IV SOLN
INTRAVENOUS | Status: DC
  Administered 2017-07-25: 10:00:00 via INTRAVENOUS

## 2017-07-25 MED ORDER — LIDOCAINE VISCOUS 2 % MT SOLN
OROMUCOSAL | Status: AC
  Filled 2017-07-25: qty 15

## 2017-07-25 MED ORDER — LIDOCAINE VISCOUS 2 % MT SOLN
OROMUCOSAL | Status: AC | PRN
  Administered 2017-07-25: 15 mL via OROMUCOSAL

## 2017-07-25 MED ORDER — FENTANYL CITRATE (PF) 100 MCG/2ML IJ SOLN
INTRAMUSCULAR | Status: AC | PRN
  Administered 2017-07-25 (×2): 50 ug via INTRAVENOUS

## 2017-07-25 MED ORDER — FENTANYL CITRATE (PF) 100 MCG/2ML IJ SOLN
INTRAMUSCULAR | Status: AC
  Filled 2017-07-25: qty 4

## 2017-07-25 MED ORDER — IOPAMIDOL (ISOVUE-300) INJECTION 61%
INTRAVENOUS | Status: AC
  Filled 2017-07-25: qty 50

## 2017-07-25 MED ORDER — IOPAMIDOL (ISOVUE-300) INJECTION 61%
10.0000 mL | Freq: Once | INTRAVENOUS | Status: AC | PRN
  Administered 2017-07-25: 10 mL

## 2017-07-25 MED ORDER — HYDROCODONE-ACETAMINOPHEN 5-325 MG PO TABS: 1.0000 | ORAL_TABLET | ORAL | Status: DC | PRN

## 2017-07-25 MED ORDER — MIDAZOLAM HCL 2 MG/2ML IJ SOLN
INTRAMUSCULAR | Status: AC
  Filled 2017-07-25: qty 4

## 2017-07-25 MED ORDER — MIDAZOLAM HCL 2 MG/2ML IJ SOLN
INTRAMUSCULAR | Status: AC | PRN
  Administered 2017-07-25 (×2): 1 mg via INTRAVENOUS

## 2017-07-25 NOTE — H&P (Signed)
Chief Complaint: Patient was seen in consultation today for suprapubic catheter exchange at the request of Dr. Franchot Gallo  Referring Physician(s): Dr. Franchot Gallo  Supervising Physician: Arne Cleveland  Patient Status: [facility] - Out-pt  History of Present Illness: Blake Cruz is a 76 y.o. male with history of prostate cancer 2015, status post prostatectomy. 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 He underwent 12 Fr suprapubic catheter placement about 5 weeks ago and is now scheduled for exchange/upsize. He has not had any major issues with the tube. Some soreness the first few days after placement as expected. PMHx, meds, labs reviewed. Pt has been NPO this am. Family at bedside. Report some nausea with sedation last procedure  Past Medical History:  Diagnosis Date  . Cancer [facility])    prostate  . GERD (gastroesophageal reflux disease)   . Glaucoma   . Hypertension   . Kidney stone     Past Surgical History:  Procedure Laterality Date  . CYSTOSCOPY/RETROGRADE/URETEROSCOPY/STONE EXTRACTION WITH BASKET N/A 05/30/2017   Procedure: CYSTOSCOPY WITH BALLOON DILATION OF BLADDER NECK CONRACTURE, LASER, INCISION OF BLADDER NECK, CONTRACTURE, DIFFICULT FOLEY PLACEMENT;  Surgeon: Kathie Rhodes, MD;  Location: [facility];  Service: Urology;  Laterality: N/A;  . PROSTATECTOMY      Allergies: Patient has no known allergies.  Medications: Prior to Admission medications   Medication Sig Start Date End Date Taking? Authorizing Provider  amLODipine-benazepril (LOTREL) 10-20 MG capsule Take 1 capsule by mouth daily. 04/20/17   [provider]  aspirin EC 81 MG tablet Take 81 mg by mouth daily.    [provider]  ciprofloxacin (CIPRO) 500 MG tablet Take 1 tablet (500 mg total) by mouth 2 (two) times daily. 05/30/17   Kathie Rhodes, MD  DEXILANT 60 MG capsule Take 60 mg by mouth daily. 05/17/17   [provider]  escitalopram (LEXAPRO) 20 MG tablet Take 20 mg by mouth daily. 05/17/17   [provider]  oxybutynin (DITROPAN) 5 MG tablet Take 5 mg by mouth every 8 (eight) hours as needed for bladder spasms.    [provider]  traMADol (ULTRAM) 50 MG tablet Take 1 tablet (50 mg total) by mouth every 6 (six) hours as needed. 05/30/17   Kathie Rhodes, MD     History reviewed. No pertinent family history.    Review of Systems: A 12 point ROS discussed and pertinent positives are indicated in the HPI above.  All other systems are negative.  Review of Systems  Vital Signs: BP 125/78   Pulse 63   Temp 99.1 F (37.3 C)   Resp 12   Ht 6\' 1"  (1.854 m)   Wt 241 lb 6.4 oz (109.5 kg)   SpO2 98%   BMI 31.85 kg/m   Physical Exam  Constitutional: He is oriented to person, place, and time. He appears well-developed. No distress.  HENT:  Head: Normocephalic.  Mouth/Throat: Oropharynx is clear and moist.  Neck: Normal range of motion. No JVD present. No tracheal deviation present.  Cardiovascular: Normal rate, regular rhythm and normal heart sounds.   Pulmonary/Chest: Effort normal and breath sounds normal. No respiratory distress.  Abdominal: Soft. There is no tenderness.  SP tune intact, site clean, no leak. Clear UOP in bag  Neurological: He is alert and oriented to person, place, and time.  Skin: Skin is warm and dry.  Psychiatric: He has a normal mood and affect.    Mallampati Score:  MD Evaluation Airway: WNL Heart: WNL Abdomen: WNL Chest/ Lungs: WNL ASA  Classification: 2 Mallampati/Airway Score: Two  Imaging: No results found.  Labs:  CBC:  Recent Labs  05/30/17 1305 06/20/17 0814  WBC 12.7* 5.7  HGB 9.5* 11.5*  HCT 30.2* 36.0*  PLT 191 212    COAGS:  Recent Labs  06/20/17 0814  INR 0.96    BMP:  Recent Labs  05/30/17 1305 06/20/17 0814  NA 144 141  K 2.9* 3.8  CL 111 107  CO2 22 24  GLUCOSE 110* 110*  BUN 14 14    CALCIUM 8.0* 9.2  CREATININE 1.42* 1.17  GFRNONAA 47* 60*  GFRAA 55* >60    LIVER FUNCTION TESTS:  Recent Labs  05/30/17 1305  BILITOT 0.5  AST 16  ALT 11*  ALKPHOS 62  PROT 7.0  ALBUMIN 3.2*    TUMOR MARKERS: No results for input(s): AFPTM, CEA, CA199, CHROMGRNA in the last 8760 hours.  Assessment and Plan: Chronic bladder outlet obstruction S/p SP tube placement 7/5 Plan for exchange upsize today with sedation Risks and benefits discussed with the patient including bleeding, infection, damage to adjacent structures, and side effects of sedation. All of the patient's questions were answered, patient is agreeable to proceed. Consent signed and in chart.    Thank you for this interesting consult.  I greatly enjoyed meeting Tou Hayner and look forward to participating in their care.  A copy of this report was sent to the requesting provider on this date.  Electronically Signed: Ascencion Dike, PA-C 07/25/2017, 10:14 AM   I spent a total of 15 minutes in face to face in clinical consultation, greater than 50% of which was counseling/coordinating care for SP catheter exchange

## 2017-07-25 NOTE — Discharge Instructions (Signed)
Moderate Conscious Sedation, Adult, Care After °These instructions provide you with information about caring for yourself after your procedure. Your health care provider may also give you more specific instructions. Your treatment has been planned according to current medical practices, but problems sometimes occur. Call your health care provider if you have any problems or questions after your procedure. °What can I expect after the procedure? °After your procedure, it is common: °· To feel sleepy for several hours. °· To feel clumsy and have poor balance for several hours. °· To have poor judgment for several hours. °· To vomit if you eat too soon. ° °Follow these instructions at home: °For at least 24 hours after the procedure: ° °· Do not: °? Participate in activities where you could fall or become injured. °? Drive. °? Use heavy machinery. °? Drink alcohol. °? Take sleeping pills or medicines that cause drowsiness. °? Make important decisions or sign legal documents. °? Take care of children on your own. °· Rest. °Eating and drinking °· Follow the diet recommended by your health care provider. °· If you vomit: °? Drink water, juice, or soup when you can drink without vomiting. °? Make sure you have little or no nausea before eating solid foods. °General instructions °· Have a responsible adult stay with you until you are awake and alert. °· Take over-the-counter and prescription medicines only as told by your health care provider. °· If you smoke, do not smoke without supervision. °· Keep all follow-up visits as told by your health care provider. This is important. °Contact a health care provider if: °· You keep feeling nauseous or you keep vomiting. °· You feel light-headed. °· You develop a rash. °· You have a fever. °Get help right away if: °· You have trouble breathing. °This information is not intended to replace advice given to you by your health care provider. Make sure you discuss any questions you have  with your health care provider. °Document Released: 09/23/2013 Document Revised: 05/07/2016 Document Reviewed: 03/24/2016 °Elsevier Interactive Patient Education © 2018 [employer] °Suprapubic Catheter Replacement, Care After °Refer to this sheet in the next few weeks. These instructions provide you with information about caring for yourself after your procedure. Your health care provider may also give you more specific instructions. Your treatment has been planned according to current medical practices, but problems sometimes occur. Call your health care provider if you have any problems or questions after your procedure. °What can I expect after the procedure? °After your procedure, it is possible to have some discomfort around the opening in your abdomen. °Follow these instructions at home: °Caring for your skin around the catheter °Use a clean washcloth and soapy water to clean the skin around your catheter every day. Pat the area dry with a clean towel. °· Do not pull on the catheter. °· Do not use ointment or lotion on this area unless told by your health care provider. °· Check your skin around the catheter every day for signs of infection. Check for: °? Redness, swelling, or pain. °? Fluid or blood. °? Warmth. °? Pus or a bad smell. ° °Caring for the catheter tube °· Clean the catheter tube with soap and water as often as told by your health care provider. °· Always make sure there are no twists or curls (kinks) in the catheter tube. °Emptying the collection bag °Empty the large collection bag every 8 hours. Empty the small collection bag when it is about ? full. To empty your   large or small collection bag, take the following steps: °· Always keep the bag below the level of the catheter. This keeps urine from flowing backwards into the catheter. °· Hold the bag over the toilet or another container. Turn the valve (spigot) at the bottom of the bag to empty the urine. °? Do not touch the opening of the  spigot. °? Do not let the opening touch the toilet or container. °· Close the spigot tightly when the bag is empty. ° °Cleaning the collection bag ° °Clean the collection bag every 2-3 days, or as often as told by your health care provider. To do this, take the following steps: °· Wash your hands with soap and water. If soap and water are not available, use hand sanitizer. °· Disconnect the bag from the catheter and immediately attach a new bag to the catheter. °· Empty the used bag completely. °· Clean the used bag using one of the following methods: °? Rinse the bag with warm water and soap. °? Fill the bag with water and add 1 tsp of vinegar. Let it sit for about 30 minutes, then empty the bag. °· Let the bag dry completely, and put it in a clean plastic bag before storing it. ° °General instructions °· Always wash your hands before and after caring for your catheter and collection bag. Use a mild, fragrance-free soap. If soap and water are not available, use hand sanitizer. °· Always make sure there are no leaks in the catheter or collection bag. °· Drink enough fluid to keep your urine clear or pale yellow. °· If you were prescribed an antibiotic medicine, take it as told by your health care provider. Do not stop taking the antibiotic even if you start to feel better. °· Do not take baths, swim, or use a hot tub. °· Keep all follow-up appointments as told by your health care provider. This is important. °Contact a health care provider if: °· You leak urine. °· You have redness, swelling, or pain around your catheter opening. °· You have fluid or blood coming from your catheter opening. °· Your catheter opening feels warm to the touch. °· You have pus or a bad smell coming from your catheter opening. °· You have a fever or chills. °· Your urine flow slows down. °· Your urine becomes cloudy or smelly. °Get help right away if: °· Your catheter comes out. °· You feel nauseous. °· You have back pain. °· You have  difficulty changing your catheter. °· You have blood in your urine. °· You have no urine flow for 1 hour. °This information is not intended to replace advice given to you by your health care provider. Make sure you discuss any questions you have with your health care provider. °Document Released: 08/21/2011 Document Revised: 08/01/2016 Document Reviewed: 08/16/2015 °Elsevier Interactive Patient Education © 2018 [employer] Inc. ° °

## 2017-07-25 NOTE — Procedures (Signed)
  Procedure:   SUPRapubic catheter exchange  Preprocedure diagnosis:  Urethral stricture Postprocedure diagnosis:  same EBL:     minimal Complications:   none immediate  See full dictation in [facility].  Dillard Cannon MD Main # [phone] Pager  [phone]

## 2017-08-29 DIAGNOSIS — N35012 Post-traumatic membranous urethral stricture: Secondary | ICD-10-CM | POA: Diagnosis not present

## 2017-10-28 NOTE — Progress Notes (Signed)
 Patient Name:  Blake Cruz Date Of Birth:  1941-04-02 Medical Record Number:  [id number] Date:  10/28/17 Diagnosis: Acute non-recurrent maxillary sinusitis [J01.00] Primary Physician: Elsie L. Carolee, MD   Maxwell Zai Chmiel is a 76 y.o. male.    CHIEF COMPLAINT   Nasal Congestion (x6d, has alot of pressure in head, nose not running at moment, has drainage going down throat) and Cough (productive cough unable to cough up mucus, no fever noticed)   ASSESSMENT AND PLAN   Assessment/Plan   Problem List    None    Visit Diagnoses    Acute non-recurrent maxillary sinusitis    -  Primary   will get sinus imaging done due to severity of pain.SABRA opth cleared otherwise.   Relevant Orders   CT Sinus Facial Bones Without Contrast   Acute pain in right eye       ck ct rule out sinus disease tumor sinuses etc   opth cleared eye exam today      HISTORY OF PRESENT ILLNESS   Subjective    sev days of worsening pain behind right eye   opto saw today and cleared pressures  And neg exam.  Has some sinus drainage  Pain severe at times    REVIEW OF SYSTEMS   Review of Systems  Constitutional: Negative.   HENT: Positive for postnasal drip, rhinorrhea, sinus pressure and sinus pain.   Eyes: Negative.        Pressure behind eyes  Respiratory: Negative.   Cardiovascular: Negative.   Gastrointestinal: Negative.   Genitourinary: Negative.   Musculoskeletal: Negative.   Neurological: Negative.     Objective   Vitals:   10/28/17 1313  BP: 126/75  BP Location: Left arm  Patient Position: Sitting  Pulse: 73  Resp: 18  Temp: 98.2 F  TempSrc: Oral  SpO2: 98%  Weight: 251 lb  Height: 6' 2  PainSc: 0-No pain    Body mass index is 32.23 kg/m.  PHYSICAL EXAMINATION   Physical Exam  Constitutional: He is oriented to person, place, and time.  obese  Eyes: EOM are normal. Pupils are equal, round, and reactive to light.  Neck: Normal range of motion. Neck supple.   Cardiovascular: Normal rate, regular rhythm and normal heart sounds.  Pulmonary/Chest: Effort normal and breath sounds normal.  Abdominal: Soft. Bowel sounds are normal.  Neurological: He is alert and oriented to person, place, and time. He displays normal reflexes. No cranial nerve deficit. Coordination normal.  Skin: Skin is warm and dry.  Nursing note and vitals reviewed.   FOLLOW UP   Return if symptoms worsen or fail to improve, for get imaging done.   William L. Carolee, MD

## 2018-05-02 ENCOUNTER — Encounter: Payer: Self-pay | Admitting: Gastroenterology

## 2019-01-25 IMAGING — CT CT IMAGE GUIDED FLUID DRAIN BY CATHETER
1 of 4 series · 11 of 32 positions shown, 17 images · non-contrast
Comparison: None.

INDICATION: 75-year-old male with urethral stricture and chronic bladder outlet
obstruction. Has a chronic indwelling Foley catheter and presents
for placement of a suprapubic catheter.

EXAM:
CT IMAGE GUIDED FLUID DRAIN BY CATHETER
CT-guided placement of suprapubic catheter

[Series 2: i-spiral 5.0 b31f · axial · 0.89mm/px · z∈[-186,-49]mm · 11 of 47 slices shown, 17 images]
[im 4/47  soft-tissue]
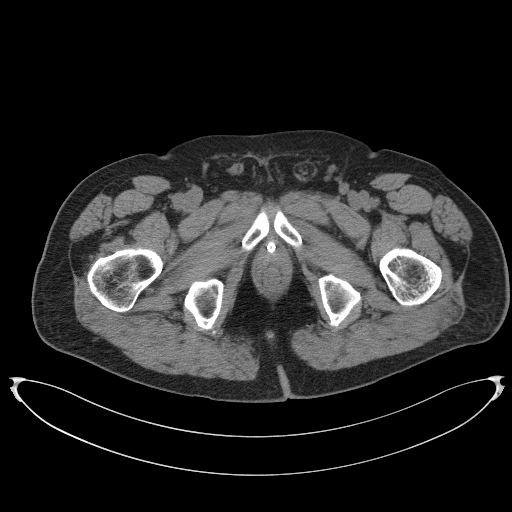
[im 4/47  bone]
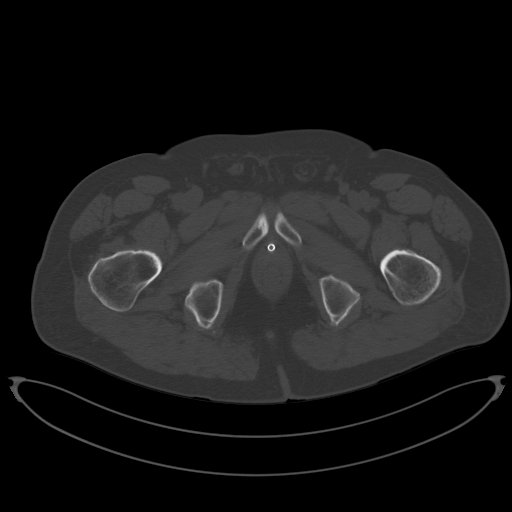
[im 8/47  soft-tissue]
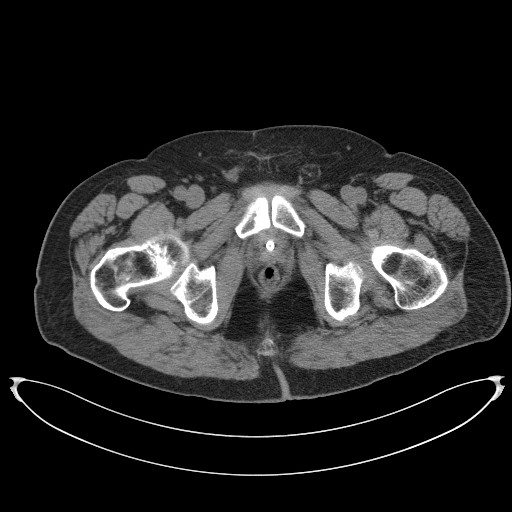
[im 12/47  soft-tissue]
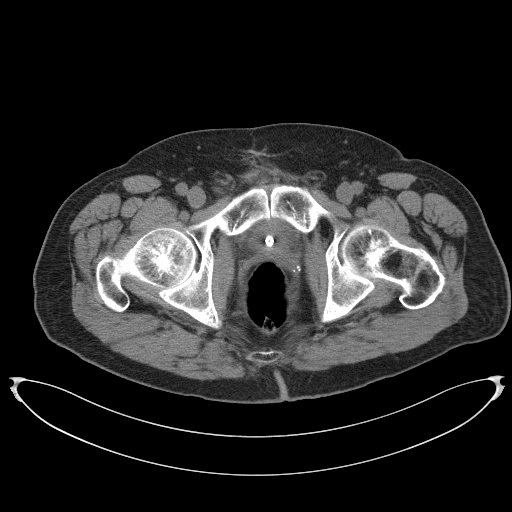
[im 16/47  soft-tissue]
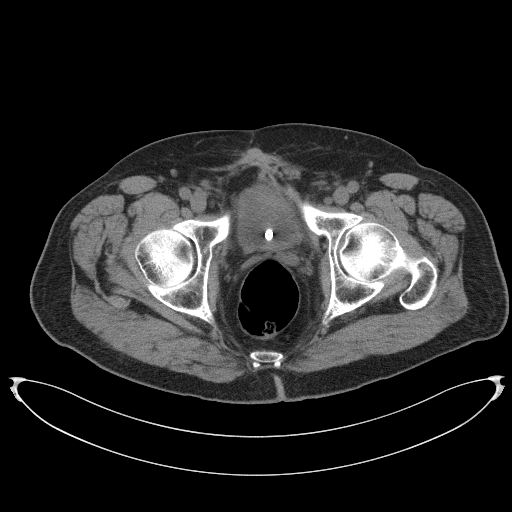
[im 20/47  soft-tissue]
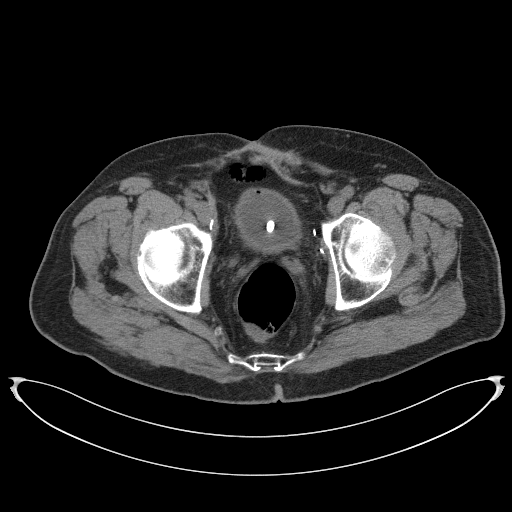
[im 24/47  soft-tissue]
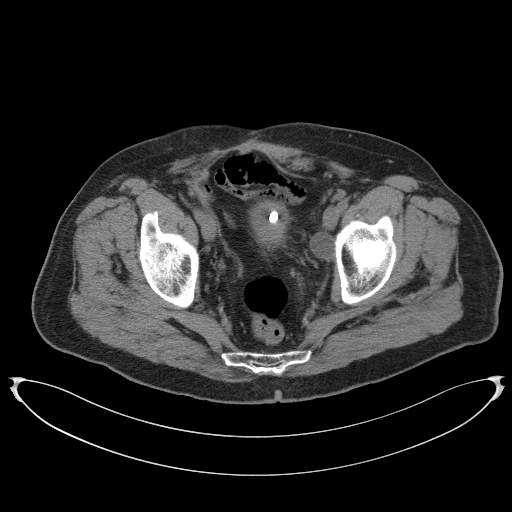
[im 27/47  soft-tissue]
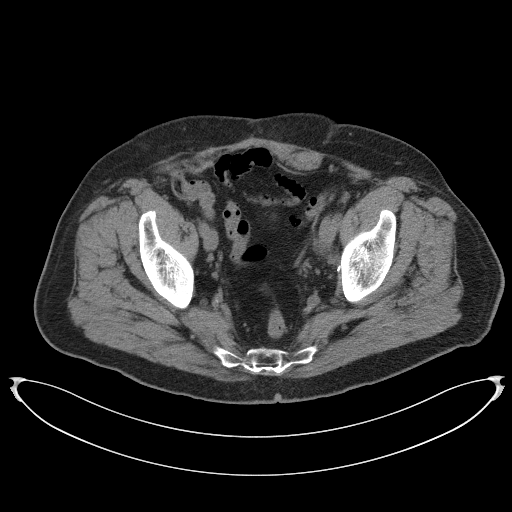
[im 31/47  soft-tissue]
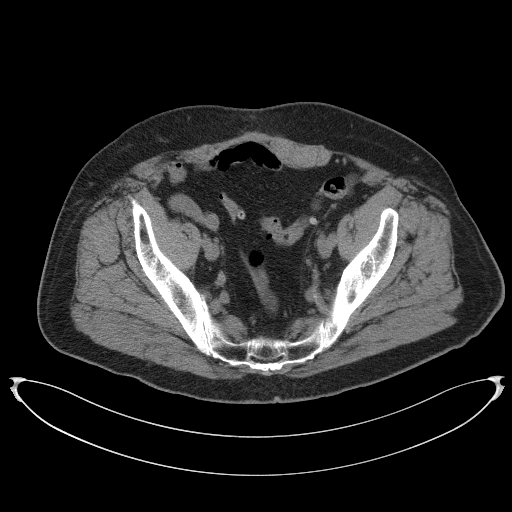
[im 31/47  lung]
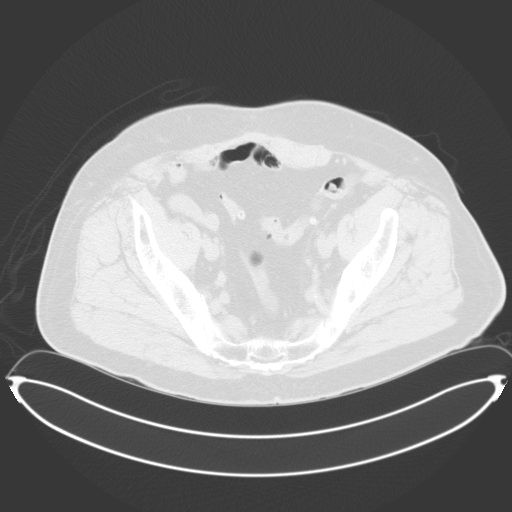
[im 35/47  soft-tissue]
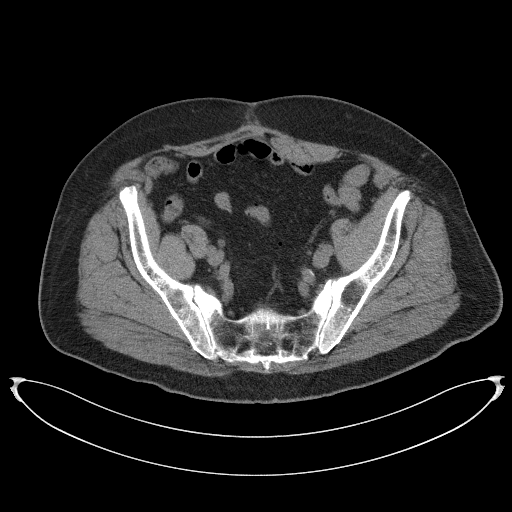
[im 35/47  lung]
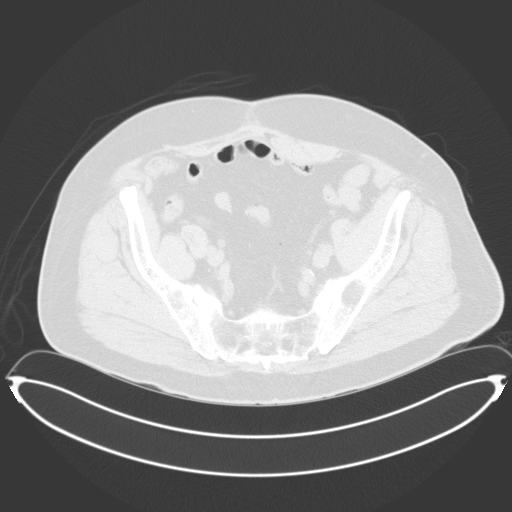
[im 35/47  bone]
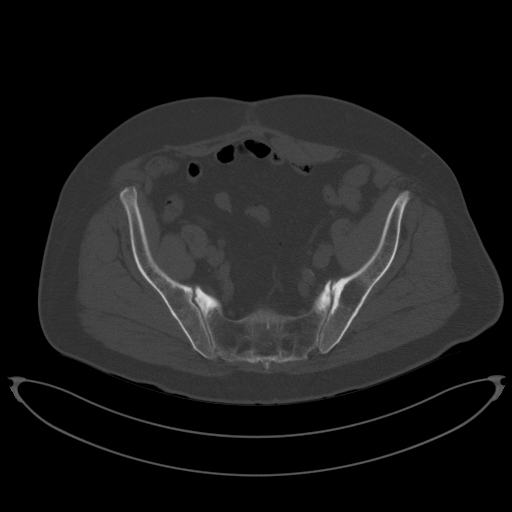
[im 39/47  soft-tissue]
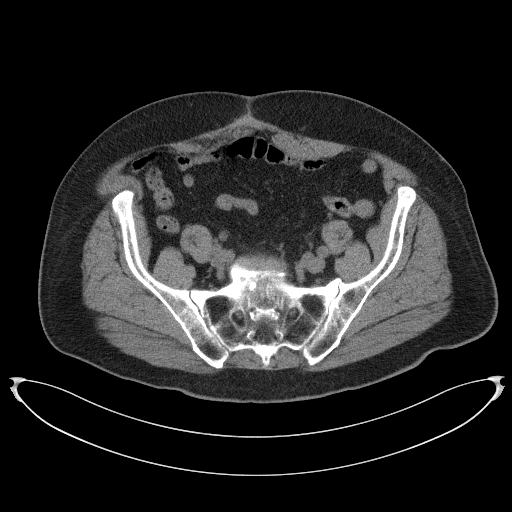
[im 39/47  lung]
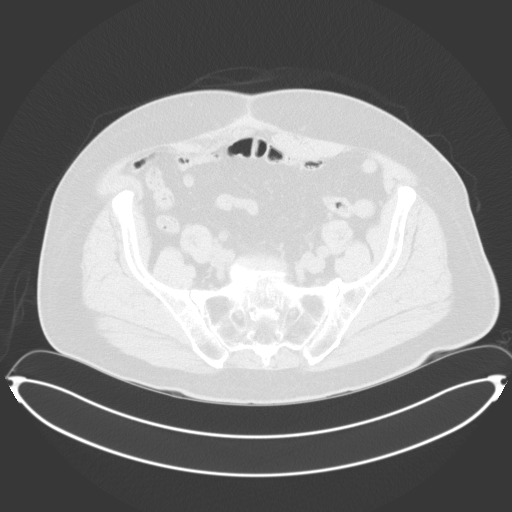
[im 43/47  soft-tissue]
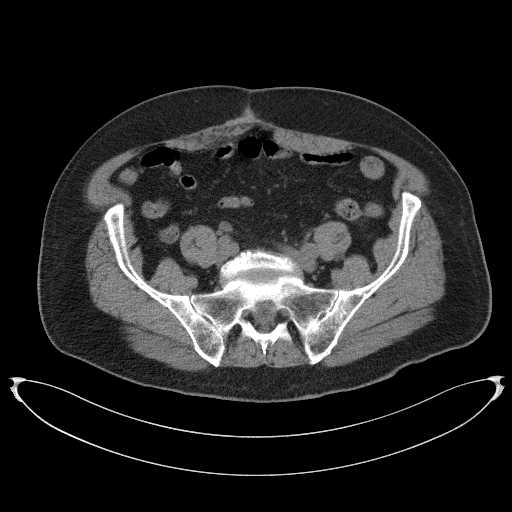
[im 43/47  lung]
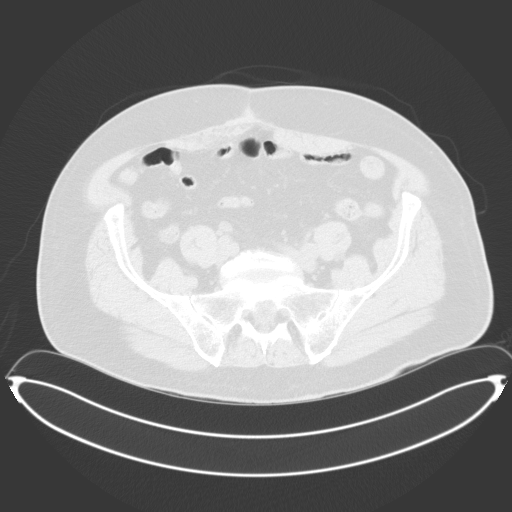

[11 of 32 positions shown; findings below may reference images not displayed]

MEDICATIONS:
Meropenem 500 mg; The antibiotic was administered in an appropriate
time frame prior to skin puncture.

ANESTHESIA/SEDATION:
Fentanyl 2 mcg IV; Versed 100 mg IV

Moderate Sedation Time:  33

The patient was continuously monitored during the procedure by the
interventional radiology nurse under my direct supervision.

CONTRAST:  None

FLUOROSCOPY TIME:  Fluoroscopy Time: 0 minutes 0 seconds (0 mGy).

COMPLICATIONS:
None immediate.

PROCEDURE:
Informed written consent was obtained from the patient after a
thorough discussion of the procedural risks, benefits and
alternatives. All questions were addressed. Maximal Sterile Barrier
Technique was utilized including caps, mask, sterile gowns, sterile
gloves, sterile drape, hand hygiene and skin antiseptic. A timeout
was performed prior to the initiation of the procedure.

A planning CT scan was performed. Thick walled relatively
decompressed bladder containing a Foley catheter. There is overlying
scar tissue as well as what appears to be mesh from a prior left
inguinal hernia repair. Furthermore, multiple loops of small bowel
are present anterior to the bladder dome.

The patient's existing Foley catheter was infused with sterile
saline. Repeat CT imaging was performed demonstrating improved
filling of the bladder. There is a safe window into the bladder of
voiding the internal mesh and small bowel from the right lower
quadrant. An appropriate skin site was selected and marked.
Following standard sterile prep and draped in the usual fashion with
chlorhexidine skin prep, local anesthesia was attained with 1%
lidocaine. A small dermatotomy was made. Under intermittent CT
guidance, an 18 gauge trocar needle was carefully advanced into the
bladder. A 0.035 wire was then coiled within the bladder and the
needle removed. The tract was serially dilated to 12 French and Jhon
[REDACTED].2 French all-purpose drainage catheter was advanced over the
wire and successfully formed in the bladder. Aspiration yields clear
urine. Follow-up CT imaging demonstrates appropriate placement of
the tube within the bladder lumen.

The suprapubic catheter was secured to the skin with 0 Prolene
suture. The patient tolerated the procedure well.
IMPRESSION: Successful placement of a 12 French suprapubic catheter.

PLAN:
catheter exchange and up size. The catheter will be serially upsized
every 4-6 weeks to a 18 French after which time they can be
converted to a Foley type catheter.

## 2019-05-26 NOTE — Telephone Encounter (Signed)
-----   Message from Hollywood L. Carolee, MD sent at 05/21/2019  8:29 AM EDT ----- Call patient normal result

## 2019-11-17 NOTE — Discharge Summary (Signed)
 Discharge Note   Patient Name: Blake Cruz  Date of Birth:  01-17-1941 Medical Record Number: [id number] Date: 11/17/19 Time: 9:49 AM    Admit Date: 11/13/2019 Discharge Date: 11/17/2019 Discharge Diagnosis:  Principal Problem:   Pneumonia due to COVID-19 virus POA: Yes Active Problems:   Urinary tract infection associated with indwelling urethral catheter (CMS/HCC) (HCC) POA: Yes   Sinus bradycardia POA: Unknown   Class 2 severe obesity due to excess calories with serious comorbidity in adult (CMS/HCC) (HCC) POA: Yes   Essential (primary) hypertension POA: Yes   Gastro-esophageal reflux disease without esophagitis POA: Yes   Hx of malignant neoplasm of prostate POA: Not Applicable   Suprapubic catheter (CMS/HCC) (HCC) POA: Not Applicable Resolved Problems:   Hypoxia POA: Yes   Hyponatremia POA: Yes   Major depressive disorder, single episode, in partial remission (CMS/HCC) (HCC) POA: Yes   Introduction: Blake Cruz is a 78 year old male with known medical history of colonic polyp, depression, GERD, essential hypertension, urolithiasis.  Prior hospitalizations, relevant procedures and outpatient visits: He has a history of prostate cancer and underwent a TURP and vaporization of his prostate on June 30, 2014.  He then underwent a radical retropubic prostatectomy on October 27, 2014. He also has a history of urethral stricture he underwent incision of a bladder neck contracture in 2018.  Also was found to have a 9 mm left renal stone found incidentally on abdominal CT scan in 2018 and initially treated with lithotripsy followed by ureteroscopy.  History of present illness: Starting October 30, 2019 he began to have cough and chest congestion.  He indicated that he was exposed to somebody at church who has COVID-19.  The patient was then brought to [facility] on November 03, 2019 and was seen by Dayton Batty, PAC that day.  That time the patient was nontoxic and not in any  acute distress.  COVID-19 PCR was positive; the patient was informed of the results on November 05, 2019 and he and his family members were advised isolation.  On that day the patient said that his fever had resolved with him not having any more cough nor any respiratory distress.  However afterwards, he gradually noted worsening dyspnea with chest tightness.  Due to the worsening dyspnea, he was brought to [facility] ED on November 13, 2019.  Blood pressure was 128/70 heart rate 60 respiration 16 temperature 38.2 degree centigrade O2 saturation 95% room air.  ED provider noted the patient to have unremarkable HEENT findings, neck findings cardiovascular findings.  Patient was noted to have rhonchi on auscultation.  He has an unremarkable abdominal findings.  Patient noted to have bilateral lower extremity edema.  Capillary refills less than 2 seconds.  Patient with no focal neurologic deficits.  He is alert oriented person and time.  Patient with normal mood and behavior.  CK 151 troponin I less than 0.03 BNP 37  CMP showed a sodium 128 potassium 3.5 BUN 19 creatinine 1.17 albumin 2.9 total protein 6.4 with no significant transaminitis.  LDH 193 hemoglobin A1c 6.1  WBC 7500 hemoglobin 11.5 hematocrit 33 MCV 88.8 platelet count 154000  CRP is elevated 8.3  Urinalysis showed 3+ urine leukocytes 2+ nitrites 1+ proteinuria urine blood is 2+ urine WBC 20 5-50 urine RBC 5-10 with urine bacteria heavy urine squamous epithelial cells of 3-5 per HPF urine WBC clumps present trace urine amorphous crystals.  D-dimer is 1627 ferritin 146.5.  Procalcitonin at 0.1.  Serum ferritin  146.5  Arterial blood gas showed a pH of 7.42 PO2 70 pCO2 31 bicarbonate 19.6 base excess -3.8 O2 saturation 92.7 methemoglobin 0.3 carboxyhemoglobin 0.2.  Lactate 1  Patient is type a Rh positive antibody screen negative.  Chest x-ray showed patchy areas of airspace opacities seen in both lungs  consistent with multifocal pneumonia.  Chest CTA PE protocol showed no PE with multifocal nonspecific patchy ground-glass airspace opacities favor pneumonia with multi station mediastinal/hilar adenopathy likely reactive with trace pleural effusions.  Small pericardial effusion.  Coronary artery disease with large hiatal hernia with an entirely intrathoracic stomach.  EKG showed normal sinus rhythm with normal EKG.  Blood cultures were taken.  Patient was then given ceftriaxone  2 g IV x1, acetaminophen  975 mg orally x1.  Was also given dexamethasone  10 mg IV x1.  He was referred to the hospitalist team and was subsequently admitted under special droplet isolation by Dr.  Sheron to room 4302 on November 12, 2019  Hospital course: During this hospitalization he was initiated on IV ceftriaxone  and IV azithromycin for a 5 day course.  He was initiated on IV remdesivir for a 5 day course starting on August 13, 2019.  Together with vitamin C, melatonin, zinc sulfate , oxygen supplementation, the patient's medical condition gradually improved.  During this hospitalization patient was noted to be mildly bradycardic with heart rate anywhere between 45 to 50 with patient not having any significant chest pains.  He is not on any AV nodal blockers.  Today the patient is finishing his last dose of IV remdesivir.  He reports no chest pain or shortness of breath.  He is for discharge on November 17, 2019  Physical examination Blood pressure 120/74 heart rate (taken by me) 60 and regular respiration 18 temperature 36.5 degree centigrade O2 saturation 97% room air.  Physical Exam: General:  Obese, no acute distress Abdomen:  Obese, Soft, Nontender, Nondistended, +bs No Hepatosplenomegaly   HEENT: NCAT, Non injected conjunctiva, Mucous Membranes Moist; hard of hearing Musculoskeletal:  Full ROM, No deformities  Neck: No thyromegaly, Trachea midline; no jugular vein distension  Skin: Warm, Dry, No rash noted   Chest: Unlabored, CTA No dullness Psychiatric: AAOx 3, Normal affect and mood   Cardiac: S1 S2 WNL , No S3 No S4, No edema Neurologic: Cranial Nerves can move arms and legs versus gravity and resistance    Discharge disposition: 1. Continue with droplet isolation 2. Follow up with PCP in 2 weeks 3. Start probiotic and finish 3 more days of oral cefdinir    Consultants None  Radiologic imaging: 1. Chest x-ray portable on November 13, 2019 showed patchy areas of airspace opacities are seen in both lungs consistent with multifocal pneumonia 2. Chest CTA PE protocol on November 13, 2019 showed no PE.  Multifocal nonspecific patchy ground-glass surface opacities favor pneumonia.  Multi station mediastinal/hilar adenopathy likely reactive.  Trace pleural effusions.  Small pericardial effusion.  Coronary artery disease.  Large hiatal hernia with an entirely intrathoracic stomach.  Condition: fair Disposition: Home  Risk of Unplanned Readmission at Discharge (No score for Observation patients)   Predictive Model Details       13% (Low) Factors Contributing to Score  Calculated 11/17/2019 08:47 Number of active Rx orders is 38  Risk of Unplanned Readmission Model ECG/EKG order is present in last 6 months    Latest calcium is low (8.3 mg/dl)    Imaging order is present in last 6 months    Age is 32  Latest hemoglobin is low (11.4 g/dL)    Number of ED visits in last six months is 1    Current length of stay is 4.034 days      Diet Instructions    Adult Discharge Diet (Specify Details)     Diet type: Regular    Activity Instructions    Up as tolerated     Up with assistive device         Medications:  Discharge meds for DC to Home/SNF/Outside facility       Ethaniel, Garfield  Home Medication Instructions [id number]   Printed on:11/17/19 0955  Medication Information                    acetaminophen  (TYLENOL ) 325 mg tablet Take 2 tablets (650 mg total) by mouth  every 6 (six) hours as needed for fever.           albuterol HFA 90 mcg/actuation inhl inhaler Inhale 2 puffs every 4 (four) hours as needed for wheezing or shortness of breath.           ascorbic acid (VITAMIN C) 500 mg tablet Take 1 tablet (500 mg total) by mouth two times a day.           aspirin (ECOTRIN) 81 mg EC tablet Take 81 mg by mouth.           benazepriL (Lotensin) 10 mg tablet Take 1 tablet (10 mg total) by mouth daily.           cefdinir (OMNICEF) 300 mg capsule Take 1 capsule (300 mg total) by mouth two times a day for 10 days.           clotrimazole-betamethasone (LOTRISONE) cream Apply topically two times a day as needed (Irritation/itching). Apply to affected area           dexAMETHasone  (DECADRON ) 6 mg tablet Take 1 tablet (6 mg total) by mouth daily for 6 doses.           dexlansoprazole (DEXILANT) 60 mg capsule TAKE 1 CAPSULE BY MOUTH TWICE DAILY           diphenoxylate-atropine (LOMOTIL) 2.5-0.025 mg per tablet TAKE TWO TABLETS BY MOUTH FOUR TIMES DAILY AS NEEDED FOR DIARRHEA           escitalopram (LEXAPRO) 20 mg tablet TAKE ONE TABLET BY MOUTH EVERY DAY           ferrous sulfate (SLOW FE) 142 mg (45 mg iron) tablet extended release tablet TAKE 1 TABLET BY MOUTH DAILY AT 6:00 AM.           furosemide  (LASIX ) 20 mg tablet TAKE ONE TABLET BY MOUTH TWICE DAILY AS NEEDED FOR FLUID IN LEGS           oxybutynin (DITROPAN) 5 mg tablet            polyethylene glycol (MIRALAX) 17 gram packet Take 17 g by mouth daily as needed (For constipation).           saccharomyces boulardii (Florastor) 250 mg capsule Take 1 capsule (250 mg total) by mouth two times a day for 7 days.           zinc sulfate (ZINCATE) 220 mg capsule Take 1 capsule (220 mg total) by mouth daily.             Follow Up:  Contact information for follow-up          William L. Carolee, MD  Specialty: Family Medicine  Relationship: PCP - General   [address] [place] [address]   Phone: [phone]     Next Steps: Follow up in 2 week(s)      Primary Care Provider: Elsie CROME. Bell, MD  Total time spent with this encounter was 35 minutes of which time was allotted to assessing patient bedside, discussing with him about his current medical condition, reviewing his discharge medications and preparation of this discharge.  Blane DELENA Lyme, MD

## 2019-12-02 NOTE — Progress Notes (Signed)
 Patient Name:  Blake Cruz Date Of Birth:  Dec 10, 1941 Medical Record Number:  [id number] Date:  12/02/2019 Diagnosis: Melena [K92.1] Consulting Physician: Elsie L. Carolee, MD Primary Physician: Elsie LITTIE. Carolee, MD Referring Physician: No ref. provider found   CHIEF COMPLAINT   Follow-up [facility] Follow Up- Pneumonia due to COVID-19 virus)   ASSESSMENT AND PLAN   Assessment/Plan   Problem List      Respiratory   Pneumonia due to COVID-19 virus   Current Assessment & Plan    Resolved asymptomatic now   Call for cough fever recurrence       Other Visit Diagnoses    Melena    -  Primary   prob related to iron intake   will ck hemeoccult  and cbc  stool heme pos today  will refer gi asap  go to er if orthostatic sx stable for now    Relevant Orders   POCT Occult Blood (Card x 1-3)   CBC   POCT Occult Blood (Card x 1-3)   Ambulatory referral to Gastroenterology   Other iron deficiency anemia       reck cbc today  doubt current gi bleed   will ck heme occult   Relevant Orders   POCT Occult Blood (Card x 1-3)   CBC   POCT Occult Blood (Card x 1-3)      HISTORY OF PRESENT ILLNESS   Blake Cruz is a 78 y.o. male.    Subjective    78 year old dc summary and medications reviewed  Admitted for covid pneumonia  Doing well now off antibiotics    No fever cough etc.  He does have black stools noted with diarrhea now  No history  Pud noted.        LABS / IMAGES / DIAGNOSTICS All have been personally reviewed and are significant for Not Applicable  CURRENT MEDICATIONS:   Current Outpatient Medications:  .  amLODIPine-benazepriL (LOTREL) 10-20 mg per capsule, Take 1 capsule by mouth daily., Disp: , Rfl:  .  aspirin (ECOTRIN) 81 mg EC tablet, Take 81 mg by mouth., Disp: , Rfl:  .  dexlansoprazole (DEXILANT) 60 mg capsule, TAKE 1 CAPSULE BY MOUTH TWICE DAILY, Disp: 180 capsule, Rfl: 3 .  escitalopram (LEXAPRO) 20 mg tablet, TAKE ONE TABLET BY MOUTH EVERY DAY, Disp:  90 tablet, Rfl: 3 .  methenamine (HIPREX) 1 gram tablet, , Disp: , Rfl:  .  oxybutynin (DITROPAN) 5 mg tablet, , Disp: , Rfl:  .  albuterol HFA 90 mcg/actuation inhl inhaler, Inhale 2 puffs every 4 (four) hours as needed for wheezing or shortness of breath. (Patient not taking: Reported on 12/02/2019 ), Disp: 18 g, Rfl: 1 .  benazepriL (Lotensin) 10 mg tablet, Take 1 tablet (10 mg total) by mouth daily., Disp: 30 tablet, Rfl: 1 .  clotrimazole-betamethasone (LOTRISONE) cream, Apply topically two times a day as needed (Irritation/itching). Apply to affected area, Disp: 30 g, Rfl: 0 .  diphenoxylate-atropine (LOMOTIL) 2.5-0.025 mg per tablet, TAKE TWO TABLETS BY MOUTH FOUR TIMES DAILY AS NEEDED FOR DIARRHEA, Disp: 20 tablet, Rfl: 0 .  ferrous sulfate (SLOW FE) 142 mg (45 mg iron) tablet extended release tablet, TAKE 1 TABLET BY MOUTH DAILY AT 6:00 AM. (Patient not taking: Reported on 12/02/2019), Disp: 60 each, Rfl: 0 .  furosemide  (LASIX ) 20 mg tablet, TAKE ONE TABLET BY MOUTH TWICE DAILY AS NEEDED FOR FLUID IN LEGS, Disp: 60 tablet, Rfl: 1 .  polyethylene glycol (MIRALAX) 17  gram packet, Take 17 g by mouth daily as needed (For constipation)., Disp: , Rfl: 0 .  zinc sulfate (ZINCATE) 220 mg capsule, Take 1 capsule (220 mg total) by mouth daily., Disp: 30 capsule, Rfl: 0 ALLERGIES: No Known Allergies PAST MEDICAL HISTORY:   Past Medical History:  Diagnosis Date  . Cancer (CMS/HCC) (HCC)   . Colon polyp   . Depression   . GERD (gastroesophageal reflux disease)   . Hypertension   . Kidney stone    PAST SURGICAL HISTORY:   Past Surgical History:  Procedure Laterality Date  . PROSTATE SURGERY     FAMILY HISTORY:   Family History  Problem Relation Age of Onset  . Heart disease Mother   . Cancer Sister   . Cancer Brother    SOCIAL HISTORY:   Social History   Socioeconomic History  . Marital status: Married    Spouse name: Not on file  . Number of children: Not on file  . Years of  education: Not on file  . Highest education level: Not on file  Occupational History  . Not on file  Social Needs  . Financial resource strain: Not hard at all  . Food insecurity    Worry: Never true    Inability: Never true  . Transportation needs    Medical: No    Non-medical: No  Tobacco Use  . Smoking status: Never Smoker  . Smokeless tobacco: Never Used  Substance and Sexual Activity  . Alcohol use: No  . Drug use: No  . Sexual activity: Not Currently    Partners: Female  Lifestyle  . Physical activity    Days per week: 0 days    Minutes per session: 0 min  . Stress: Not at all  Relationships  . Social connections    Talks on phone: More than three times a week    Gets together: More than three times a week    Attends religious service: Never    Active member of club or organization: No    Attends meetings of clubs or organizations: Never    Relationship status: Married  . Intimate partner violence    Fear of current or ex partner: No    Emotionally abused: No    Physically abused: No    Forced sexual activity: No  Other Topics Concern  . Not on file  Social History Narrative  . Not on file     REVIEW OF SYSTEMS   Review of Systems  Respiratory:       Recent covid pneumonia resolved  Cardiovascular:       Stable hypertension   Gastrointestinal: Positive for diarrhea.       Stool black  With diarrhea   History  Colonoscopy polyps noted   Up to date   All other systems reviewed and are negative.   Objective   Vitals:   12/02/19 0816  BP: 114/71  BP Location: Left arm  Patient Position: Sitting  Pulse: 75  Resp: 18  Temp: (!) 95.6 F  TempSrc: Temporal  SpO2: 97%  Weight: 250 lb  Height: 6' 2  PainSc: 0-No pain    Body mass index is 32.1 kg/m.   PHYSICAL EXAMINATION   Physical Exam Vitals signs and nursing note reviewed.  Constitutional:      General: He is not in acute distress.    Appearance: He is well-developed. He is obese. He  is not ill-appearing, toxic-appearing or diaphoretic.  HENT:  Right Ear: External ear normal.     Left Ear: External ear normal.     Nose: Nose normal.     Mouth/Throat:     Mouth: Mucous membranes are moist.     Pharynx: Oropharynx is clear.  Eyes:     Conjunctiva/sclera: Conjunctivae normal.     Pupils: Pupils are equal, round, and reactive to light.  Neck:     Musculoskeletal: Normal range of motion and neck supple.     Thyroid : No thyromegaly.  Cardiovascular:     Rate and Rhythm: Normal rate and regular rhythm.     Pulses:          Dorsalis pedis pulses are 2+ on the right side and 2+ on the left side.       Posterior tibial pulses are 2+ on the right side and 2+ on the left side.     Heart sounds: Normal heart sounds.  Pulmonary:     Effort: Pulmonary effort is normal.     Breath sounds: Normal breath sounds.  Abdominal:     General: Bowel sounds are normal.     Palpations: Abdomen is soft. There is no mass.     Tenderness: There is no abdominal tenderness.     Hernia: No hernia is present.  Musculoskeletal: Normal range of motion.        General: No tenderness.     Right foot: Normal range of motion. No deformity.     Left foot: Normal range of motion. No deformity.  Feet:     Right foot:     Protective Sensation: 4 sites tested. 4 sites sensed.     Skin integrity: No ulcer, blister, skin breakdown, erythema, warmth, callus or dry skin.     Left foot:     Protective Sensation: 4 sites tested. 4 sites sensed.     Skin integrity: No ulcer, blister, skin breakdown, erythema, warmth, callus or dry skin.  Lymphadenopathy:     Cervical: No cervical adenopathy.  Skin:    General: Skin is warm and dry.     Capillary Refill: Capillary refill takes less than 2 seconds.     Findings: No rash.  Neurological:     General: No focal deficit present.     Mental Status: He is alert and oriented to person, place, and time. Mental status is at baseline.     Cranial Nerves: No  cranial nerve deficit.     Motor: No abnormal muscle tone.     Coordination: Coordination normal.     Deep Tendon Reflexes: Reflexes are normal and symmetric. Reflexes normal.  Psychiatric:        Behavior: Behavior normal.        Thought Content: Thought content normal.        Judgment: Judgment normal.      FOLLOW UP   Return if symptoms worsen or fail to improve.

## 2020-01-25 NOTE — Progress Notes (Signed)
 Patient Name:  Blake Cruz Date Of Birth:  26-Feb-1941 Medical Record Number:  [id number] Date:  01/25/2020 Diagnosis: Pain of upper abdomen [R10.10] Primary Physician: Elsie L. Carolee, MD   [name] is a 79 y.o. male.    CHIEF COMPLAINT   Referral (Needs gastro referral to [facility]. )   ASSESSMENT AND PLAN   Assessment/Plan   Problem List    None    Visit Diagnoses    Pain of upper abdomen    -  Primary   assoc with gas and peanut butter mostly  hx colon polyps also needs gi referal  try levsin   Relevant Orders   Ambulatory referral to Gastroenterology      HISTORY OF PRESENT ILLNESS   Subjective    Abdominal Pain This is a recurrent problem. The onset quality is gradual. The problem occurs intermittently. The problem has been rapidly worsening. The pain is located in the generalized abdominal region. The pain is at a severity of 4/10. The pain is moderate. The quality of the pain is aching. The abdominal pain does not radiate. Associated symptoms include flatus. Pertinent negatives include no anorexia, arthralgias, belching, constipation, diarrhea, dysuria, fever, frequency, headaches, hematochezia, hematuria, melena, myalgias, nausea, vomiting or weight loss. Associated symptoms comments: belching. Exacerbated by: peanut butter. Relieved by: avoiding peanut butter. He has tried nothing for the symptoms. The treatment provided no relief. There is no history of abdominal surgery, colon cancer, Crohn's disease, gallstones, GERD, irritable bowel syndrome, pancreatitis, PUD or ulcerative colitis. polys    HISTORIES REVIEWED  The following sections of the medical record have been reviewed, and updated as appropriate, during this encounter: Tobacco  Allergies  Meds  Problems  Med Hx  Surg Hx  Fam Hx       REVIEW OF SYSTEMS   Review of Systems  Constitutional: Negative for fever and weight loss.  HENT: Negative.   Respiratory: Negative.   Cardiovascular:  Negative.   Gastrointestinal: Positive for abdominal distention, abdominal pain (with peanut butter) and flatus. Negative for anal bleeding, anorexia, blood in stool, constipation, diarrhea, hematochezia, melena, nausea and vomiting.  Genitourinary: Negative for dysuria, frequency and hematuria.  Musculoskeletal: Negative for arthralgias and myalgias.  Neurological: Negative for headaches.    Objective   Vitals:   01/25/20 1418  BP: (!) 133/73  BP Location: Left arm  Patient Position: Sitting  Pulse: 75  Resp: 18  Temp: 97.5 F  TempSrc: Temporal  SpO2: 98%  Weight: 260 lb 12.8 oz  Height: 6' 2  PainSc: 0-No pain    Body mass index is 33.48 kg/m.  PHYSICAL EXAMINATION   Physical Exam Vitals signs and nursing note reviewed.  Constitutional:      General: He is not in acute distress.    Appearance: He is obese. He is not ill-appearing, toxic-appearing or diaphoretic.  Cardiovascular:     Rate and Rhythm: Normal rate and regular rhythm.     Pulses: Normal pulses.     Heart sounds: Normal heart sounds.  Pulmonary:     Effort: Pulmonary effort is normal.     Breath sounds: Normal breath sounds.  Abdominal:     General: Abdomen is flat. There is no distension.     Palpations: Abdomen is soft. There is no mass.     Tenderness: There is no abdominal tenderness.     Hernia: No hernia is present.  Musculoskeletal:     Right lower leg: Edema present.     Left  lower leg: Edema present.  Skin:    General: Skin is warm and dry.  Neurological:     Mental Status: He is alert.     FOLLOW UP   Return if symptoms worsen or fail to improve.   William L. Carolee, MD  Encounter time

## 2020-01-25 NOTE — Progress Notes (Signed)
 This encounter has occurred during a public health emergency, specifically the COVID-19 pandemic.  Our staff have instituted additional safety precautions for the safety of our patients, including but not limited to:  additional PPE (personal protective equipment) offered to staff and patients, temperature screening at our entrance, additional staggered time for arrival, clinic spacing for social distancing, and additional enhanced cleaning/disinfection between patients.

## 2020-05-11 NOTE — Progress Notes (Signed)
 Patient Name:  Blake Cruz Date Of Birth:  [birth year] Medical Record Number:  [id number] Date:  05/11/2020 Diagnosis: Abdominal wall abscess [L02.211] Primary Physician: Elsie L. Carolee, MD   Blake Cruz is a 79 y.o. male.    CHIEF COMPLAINT   Boils (comes and goes, duration a few months. One on left groin area, left abdomen, and right axillary. 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   Had in past also  Otherwise feels ok   No fever chills etc       Nothing has helped topically     HISTORIES REVIEWED  The following sections of the medical record have been reviewed, and updated as appropriate, during this encounter: Tobacco  Allergies  Meds  Problems  Med Hx  Surg Hx  Fam Hx       REVIEW OF SYSTEMS   Review of Systems  HENT: Positive for hearing loss.   Skin:       Abscess left abdominal and groin  All other systems reviewed and are negative.   Objective   Vitals:   05/11/20 0817  BP: (!) 132/78  BP Location: Left arm  Patient Position: Sitting  Pulse: 72  Resp: 18  Temp: 97.6 F  TempSrc: Temporal  SpO2: 98%  Weight: 261 lb 9.6 oz  Height: 6' 2  PainSc: 0-No pain    Body mass index is 33.59 kg/m.  PHYSICAL EXAMINATION   Physical Exam Vitals and nursing note reviewed.  Constitutional:      General: He is not in acute distress.    Appearance: Normal appearance. He is obese. He is not ill-appearing, toxic-appearing or diaphoretic.  Cardiovascular:      Rate and Rhythm: Normal rate and regular rhythm.  Musculoskeletal:     Cervical back: No tenderness.  Lymphadenopathy:     Cervical: No cervical adenopathy.  Skin:    General: Skin is warm and dry.     Findings: Lesion (3 centi abscess left abd wall and left groin area  not draining) present.  Neurological:     Mental Status: He is alert.     FOLLOW UP   Return if symptoms worsen or fail to improve.   William L. Carolee, MD  Encounter time

## 2022-10-01 NOTE — Progress Notes (Signed)
 Reviewing patient chart for a recent admission date.

## 2022-12-12 NOTE — Progress Notes (Signed)
 Patient Name:  Blake Cruz Date Of Birth:  1941/10/27 Medical Record Number:  [id number] Date:  12/12/2022 Diagnosis: Acute cystitis without hematuria [N30.00] Primary Physician: Elsie L. Carolee, MD   Blake Cruz is a 81 y.o. male.    CHIEF COMPLAINT   UTI (Bladder infection, started on 11/26/2022, having some burning with urination, states that it started when he put his new cathter in)   ASSESSMENT AND PLAN   Assessment/Plan  Problem List   None Visit Diagnoses     Acute cystitis without hematuria    -  Primary   will treat push fluids   follow up uro as neededs   ck culture   Relevant Orders   Urine culture   Winter itch       try kenalog cream  hydration methods  discussed   Relevant Medications   Nyamyc 100,000 unit/gram powder   triamcinolone (KENALOG) 0.1 % cream       HISTORY OF PRESENT ILLNESS   Subjective   81 year old with dysuria  with catheter   uro is following  no fever noted  .  Also has winter itch arms trunk  UTI  This is a recurrent problem. The current episode started in the past 7 days. The problem occurs every urination. The problem has been gradually worsening. The quality of the pain is described as burning. The pain is at a severity of 7/10. The pain is severe. There has been no fever. He is Not sexually active. There is No history of pyelonephritis. Associated symptoms include frequency and urgency. Pertinent negatives include no chills, discharge, flank pain, hematuria, hesitancy, nausea, possible pregnancy, sweats or vomiting. He has tried nothing for the symptoms. The treatment provided no relief. His past medical history is significant for catheterization, kidney stones and recurrent UTIs.    HISTORIES REVIEWED  The following sections of the medical record have been reviewed, and updated as appropriate, during this encounter:  Tobacco  Allergies  Meds  Problems  Med Hx  Surg Hx  Fam Hx       REVIEW OF SYSTEMS   Review of  Systems  Constitutional:  Negative for chills.  Cardiovascular: Negative.   Gastrointestinal:  Positive for abdominal pain (suprapubic). Negative for nausea and vomiting.  Genitourinary:  Positive for difficulty urinating, dysuria, frequency and urgency. Negative for enuresis, flank pain, genital sores, hematuria, hesitancy, penile discharge, penile pain, penile swelling, scrotal swelling and testicular pain.  Skin:  Positive for rash (winter itch).  All other systems reviewed and are negative.   Objective  Vitals:   12/12/22 0813  BP: (!) 136/78  BP Location: Left arm  Patient Position: Sitting  Pulse: 64  Resp: 18  Temp: 97.7 F  TempSrc: Temporal  SpO2: 98%  Weight: 270 lb  Height: 6'  PainSc: 10-Worst pain ever  PainLoc: Penis    Body mass index is 36.62 kg/m.  PHYSICAL EXAMINATION   Physical Exam Vitals and nursing note reviewed.  Constitutional:      General: He is not in acute distress.    Appearance: He is obese. He is not ill-appearing, toxic-appearing or diaphoretic.  Cardiovascular:     Rate and Rhythm: Normal rate and regular rhythm.     Pulses: Normal pulses.     Heart sounds: Normal heart sounds.  Pulmonary:     Effort: Pulmonary effort is normal.     Breath sounds: Normal breath sounds.  Abdominal:     General: There is no  distension.     Palpations: There is no mass.     Tenderness: There is no abdominal tenderness (suprapubic). There is no right CVA tenderness, left CVA tenderness, guarding or rebound.     Hernia: No hernia is present.  Skin:    Findings: Erythema and rash (arms hands) present.  Neurological:     Mental Status: He is alert.     FOLLOW UP   Return if symptoms worsen or fail to improve.   William L. Carolee, MD  Encounter time

## 2023-05-03 NOTE — Progress Notes (Signed)
 Please call the patient regarding his abnormal result.    Recommend change to doxycycline  will call in  for 5 more days therapy   recheck ua 1 wk

## 2023-10-09 NOTE — Progress Notes (Signed)
 Patient Name:  Blake Cruz Date Of Birth:  1941/06/14 Medical Record Number:  [id number] Date:  10/09/2023 Diagnosis: Eczema, unspecified type [L30.9] Primary Physician: Elsie Cruz. Carolee, MD   Blake Cruz Pay is a 82 y.o. male.    CHIEF COMPLAINT   Itching (Patient states that he is itching all over, has some spots on his legs)   ASSESSMENT AND PLAN   Assessment/Plan  Problem List   None Visit Diagnoses     Eczema, unspecified type    -  Primary   could be insect bites  but not clear etiology.  try atarax  kenalog cream and inj 40 mg im   Relevant Medications   triamcinolone acetonide (KENALOG-40) 40 mg/mL injection 40 mg (Start on 10/09/2023 10:30 AM)   triamcinolone (KENALOG) 0.1 % cream       HISTORY OF PRESENT ILLNESS   Subjective   82 year old with pruritus  and scattered  red papules  trunk legs arms  sev days  nothing has helped. Otherwise feels well    HISTORIES REVIEWED  The following sections of the medical record have been reviewed, and updated as appropriate, during this encounter:  Tobacco  Allergies  Meds  Problems  Med Hx  Surg Hx  Fam Hx       REVIEW OF SYSTEMS   Review of Systems  Skin:  Positive for rash (scattered red pap rash trunk legs).  All other systems reviewed and are negative.   Objective  Vitals:   10/09/23 0906  BP: (!) 145/82  BP Location: Left arm  Patient Position: Sitting  Pulse: 68  Resp: 18  Temp: 97.8 F  TempSrc: Temporal  SpO2: 98%  Weight: 281 lb 6.4 oz  Height: 6'  PainSc: 0-No pain    Body mass index is 38.16 kg/m.  PHYSICAL EXAMINATION   Physical Exam Vitals and nursing note reviewed.  Constitutional:      General: He is not in acute distress.    Appearance: Normal appearance. He is not ill-appearing, toxic-appearing or diaphoretic.  Cardiovascular:     Rate and Rhythm: Normal rate and regular rhythm.     Pulses: Normal pulses.     Heart sounds: Normal heart sounds.  Skin:    General:  Skin is warm and dry.     Findings: Erythema and rash (scattered eryth mac pap rash trunk arms legs) present.  Neurological:     General: No focal deficit present.     Mental Status: He is alert and oriented to person, place, and time.     FOLLOW UP   Return if symptoms worsen or fail to improve.   Blake L. Carolee, MD  Encounter time

## 2024-01-27 NOTE — Progress Notes (Signed)
 Patient Name:  Blake Cruz Date Of Birth:  20-[name]-1941 Medical Record Number:  [id number] Date:  01/27/2024 Diagnosis: Dysuria [R30.0] Primary Physician: Elsie L. Carolee, MD   Blake Cruz is a 83 y.o. male.    CHIEF COMPLAINT   UTI (Patient thinks he may have a kidney infection, burning since saturday)   ASSESSMENT AND PLAN   Assessment/Plan  Problem List   None Visit Diagnoses     Dysuria    -  Primary   Relevant Orders   POCT Urinalysis AUTO W/O Scope   Urine culture   Acute cystitis without hematuria       will culture and treat   push fluids  call if no improvement   Relevant Orders   Urine culture       HISTORY OF PRESENT ILLNESS   Subjective   UTI  This is a recurrent problem. The current episode started in the past 7 days. The problem occurs every urination. The problem has been gradually worsening. The quality of the pain is described as burning. The pain is at a severity of 5/10. The pain is moderate. There has been no fever. There is No history of pyelonephritis. Associated symptoms include frequency and urgency. Pertinent negatives include no chills, discharge, flank pain, hematuria, hesitancy, nausea, possible pregnancy, sweats or vomiting. He has tried nothing for the symptoms. The treatment provided no relief. His past medical history is significant for recurrent UTIs. There is no history of catheterization, kidney stones, a single kidney, urinary stasis or a urological procedure.    HISTORIES REVIEWED  The following sections of the medical record have been reviewed, and updated as appropriate, during this encounter:  Tobacco  Allergies  Meds  Problems  Med Hx  Surg Hx  Fam Hx       REVIEW OF SYSTEMS   Review of Systems  Constitutional:  Negative for chills.  Respiratory: Negative.    Gastrointestinal:  Negative for abdominal distention, abdominal pain, anal bleeding, nausea and vomiting.  Genitourinary:  Positive for difficulty  urinating, frequency and urgency. Negative for dysuria, enuresis, flank pain, hematuria, hesitancy, penile discharge, penile pain, penile swelling, scrotal swelling and testicular pain.  Musculoskeletal: Negative.   All other systems reviewed and are negative.   Objective  Vitals:   01/27/24 0841  BP: (!) 141/83  BP Location: Left arm  Patient Position: Sitting  Pulse: 75  Resp: 18  Temp: 97.9 F  TempSrc: Temporal  SpO2: 98%  Weight: 282 lb  Height: 6'  PainSc: 10-Worst pain ever    Body mass index is 38.25 kg/m.  PHYSICAL EXAMINATION   Physical Exam Vitals and nursing note reviewed.  Constitutional:      General: He is not in acute distress.    Appearance: He is obese. He is not ill-appearing, toxic-appearing or diaphoretic.  HENT:     Head: Normocephalic and atraumatic.  Cardiovascular:     Rate and Rhythm: Normal rate and regular rhythm.     Pulses: Normal pulses.     Heart sounds: Normal heart sounds.  Pulmonary:     Breath sounds: Normal breath sounds.  Abdominal:     General: Abdomen is flat. There is no distension.     Palpations: Abdomen is soft. There is no mass.     Tenderness: There is abdominal tenderness. There is no right CVA tenderness, left CVA tenderness, guarding or rebound.     Hernia: No hernia is present.  Musculoskeletal:     Right  lower leg: No edema.     Left lower leg: No edema.  Skin:    General: Skin is warm and dry.  Neurological:     General: No focal deficit present.     Mental Status: He is alert and oriented to person, place, and time.  Psychiatric:        Mood and Affect: Mood normal.        Behavior: Behavior normal.     FOLLOW UP   Return if symptoms worsen or fail to improve.   William L. Carolee, MD  Encounter time

## 2024-01-30 NOTE — Progress Notes (Signed)
 Please call the patient regarding his abnormal result.  See what antibiotic he is taking  it is resistant to cipro   may need to change if taking

## 2024-04-13 NOTE — Progress Notes (Signed)
 Patient Name:  Blake Cruz Date Of Birth:  [name] 05, 1941 Medical Record Number:  [id number] Date:  04/13/2024 Diagnosis: Exposure to SARS virus [Z20.828] Primary Physician: Elsie L. Carolee, MD   Blake Cruz Pay is a 83 y.o. male.    CHIEF COMPLAINT   Cough (Cough, congestion, sinus drainage)   ASSESSMENT AND PLAN   Assessment/Plan  Problem List   None Visit Diagnoses     Exposure to SARS virus    -  Primary   Relevant Orders   POCT SARS-COV-2/FLU ANTIGEN (SOFIA)   COPD exacerbation (CMS/HCC)       cont inhalers   ck flu and covid  treat if pos  otherwise  push fluids  and cover bronchitis  call if worse for cxr   Relevant Medications   promethazine-DM (PROMETHAZINE-DM) 6.25-15 mg/5 mL syrup       HISTORY OF PRESENT ILLNESS   Subjective   Cough This is a new problem. The current episode started in the past 7 days. The problem has been gradually worsening. The problem occurs constantly. The cough is Productive of sputum. Associated symptoms include nasal congestion, postnasal drip, a rash (lower legs  itching) and rhinorrhea. Pertinent negatives include no chest pain, chills, ear congestion, ear pain, fever, headaches, heartburn, hemoptysis, myalgias, sore throat, shortness of breath, sweats, weight loss or wheezing. Nothing aggravates the symptoms. He has tried nothing for the symptoms. The treatment provided no relief. There is no history of asthma, bronchiectasis, bronchitis, COPD, emphysema, environmental allergies or pneumonia.    HISTORIES REVIEWED  The following sections of the medical record have been reviewed, and updated as appropriate, during this encounter:  Tobacco  Allergies  Meds  Problems  Med Hx  Surg Hx  Fam Hx       REVIEW OF SYSTEMS   Review of Systems  Constitutional:  Negative for chills, fever and weight loss.  HENT:  Positive for postnasal drip and rhinorrhea. Negative for ear pain and sore throat.   Respiratory:  Positive for cough.  Negative for hemoptysis, shortness of breath and wheezing.   Cardiovascular:  Negative for chest pain.  Gastrointestinal: Negative.  Negative for heartburn.  Musculoskeletal:  Negative for myalgias.  Skin:  Positive for rash (lower legs  itching).  Allergic/Immunologic: Negative for environmental allergies.  Neurological:  Negative for headaches.    Objective  Vitals:   04/13/24 1048  BP: (!) 149/84  BP Location: Left arm  Patient Position: Sitting  Pulse: 80  Resp: 18  Temp: 97.5 F  TempSrc: Temporal  SpO2: 98%  Weight: 280 lb  Height: 6'  PainSc: 0-No pain    Body mass index is 37.97 kg/m.  PHYSICAL EXAMINATION   Physical Exam Vitals and nursing note reviewed.  Constitutional:      General: He is not in acute distress.    Appearance: He is obese. He is not ill-appearing, toxic-appearing or diaphoretic.  HENT:     Nose: Congestion and rhinorrhea present.     Mouth/Throat:     Pharynx: Posterior oropharyngeal erythema present. No oropharyngeal exudate.  Cardiovascular:     Rate and Rhythm: Normal rate and regular rhythm.     Pulses: Normal pulses.     Heart sounds: Normal heart sounds.  Pulmonary:     Effort: Pulmonary effort is normal.     Breath sounds: Normal breath sounds.  Skin:    General: Skin is warm and dry.  Neurological:     Mental Status: He is alert.  FOLLOW UP   Return if symptoms worsen or fail to improve.   William L. Carolee, MD  Encounter time

## 2024-04-21 NOTE — Progress Notes (Signed)
 Patient Name:  Blake Cruz Date Of Birth:  Mar 13, 1941 Medical Record Number:  [id number] Date:  04/21/2024 Diagnosis: Gastroenteritis [K52.9] Primary Physician: Elsie L. Carolee, MD   Blake Cruz is a 83 y.o. male.    CHIEF COMPLAINT   Nausea (He has been sick on his stomach for about 4 days, watery stool, wife had the same thing last week,)   ASSESSMENT AND PLAN   Assessment/Plan  Problem List   None Visit Diagnoses     Gastroenteritis    -  Primary   prob viral ut will ck c diff cultures  and hemeoccult.  poss pud bleed noted  ck labs  go to er if worse   Relevant Medications   hyoscyamine (Levsin/SL) 0.125 mg SL tablet   Other Relevant Orders   Stool culture   C. Difficile Toxins A+B, EIA       HISTORY OF PRESENT ILLNESS   Subjective   83 year old with n/v  diarrhea for 4 days  with melena.  No history  pud noted  feels weak. Sister had same symptoms  last week.   Worse with eating but is keeping down ginger ale.    Nothing helping much    HISTORIES REVIEWED  The following sections of the medical record have been reviewed, and updated as appropriate, during this encounter:  Tobacco  Allergies  Meds  Problems  Med Hx  Surg Hx  Fam Hx       REVIEW OF SYSTEMS   Review of Systems  Constitutional:  Positive for activity change and fatigue.  Respiratory: Negative.    Cardiovascular: Negative.   Gastrointestinal:  Positive for abdominal pain, diarrhea, nausea and vomiting. Negative for anal bleeding, blood in stool, constipation and rectal pain.       Melena  Genitourinary: Negative.   All other systems reviewed and are negative.   Objective  Vitals:   04/21/24 0820  BP: (!) 137/78  BP Location: Left arm  Patient Position: Sitting  Pulse: 73  Resp: 18  Temp: 97.9 F  TempSrc: Temporal  SpO2: 98%  Weight: 274 lb  Height: 6'  PainSc: 0-No pain    Body mass index is 37.16 kg/m.  PHYSICAL EXAMINATION   Physical Exam Vitals and  nursing note reviewed.  Constitutional:      General: He is not in acute distress.    Appearance: He is ill-appearing. He is not toxic-appearing or diaphoretic.  Cardiovascular:     Rate and Rhythm: Normal rate and regular rhythm.     Pulses: Normal pulses.     Heart sounds: Normal heart sounds.  Pulmonary:     Effort: Pulmonary effort is normal.     Breath sounds: Normal breath sounds.  Abdominal:     General: There is no distension.     Palpations: Abdomen is soft. There is no mass.     Tenderness: There is abdominal tenderness. There is no right CVA tenderness, left CVA tenderness, guarding or rebound.     Hernia: No hernia is present.  Skin:    General: Skin is warm and dry.  Neurological:     General: No focal deficit present.     Mental Status: He is alert and oriented to person, place, and time.     FOLLOW UP   Return if symptoms worsen or fail to improve.   William L. Carolee, MD  Encounter time

## 2024-08-10 NOTE — Progress Notes (Signed)
 Patient Name:  Blake Cruz Date Of Birth:  July 02, 1941 Medical Record Number:  [id number] Date:  08/10/2024 Diagnosis: Vertigo [R42] Primary Physician: Elsie L. Carolee, MD   Blake Cruz is a 83 y.o. male.    CHIEF COMPLAINT   Dizziness (Started about 2 days ago)   ASSESSMENT AND PLAN   Assessment/Plan  Problem List   None Visit Diagnoses       Vertigo    -  Primary   poss vertebral steal  or posterior tia or cva  ck labs and carotid us   and ct head asap   Relevant Orders   Ultrasound Carotid Bilateral   CT Head Without Contrast   Comprehensive metabolic panel   CBC       HISTORY OF PRESENT ILLNESS   Subjective   Dizziness This is a new problem. The current episode started in the past 7 days. The problem occurs constantly. The problem has been gradually worsening. Associated symptoms include nausea and weakness. Pertinent negatives include no abdominal pain, anorexia, arthralgias, change in bowel habit, chest pain, chills, congestion, coughing, diaphoresis, fatigue, fever, headaches, joint swelling, myalgias, neck pain, numbness, rash, sore throat, swollen glands, urinary symptoms, vertigo, visual change or vomiting. The symptoms are aggravated by walking and bending (positional). He has tried nothing for the symptoms. The treatment provided no relief.    HISTORIES REVIEWED  The following sections of the medical record have been reviewed, and updated as appropriate, during this encounter:  Tobacco  Allergies  Meds  Problems  Med Hx  Surg Hx  Fam Hx       REVIEW OF SYSTEMS   Review of Systems  Constitutional:  Negative for chills, diaphoresis, fatigue and fever.  HENT:  Negative for congestion and sore throat.   Respiratory:  Negative for cough.   Cardiovascular: Negative.  Negative for chest pain.  Gastrointestinal:  Positive for nausea. Negative for abdominal pain, anorexia, change in bowel habit and vomiting.  Genitourinary: Negative.    Musculoskeletal:  Negative for arthralgias, joint swelling, myalgias and neck pain.  Skin:  Negative for rash.  Neurological:  Positive for dizziness, weakness and light-headedness. Negative for vertigo, tremors, seizures, syncope, facial asymmetry, speech difficulty, numbness and headaches.  Hematological: Negative.   Psychiatric/Behavioral: Negative.    All other systems reviewed and are negative.   Objective  Vitals:   08/10/24 1302  BP: (!) 144/74  BP Location: Left arm  Patient Position: Sitting  Pulse: 69  Resp: 18  Temp: 97.9 F  TempSrc: Temporal  SpO2: 98%  Weight: 270 lb  Height: 6'  PainSc: 0-No pain    Body mass index is 36.62 kg/m.  PHYSICAL EXAMINATION   Physical Exam Vitals and nursing note reviewed.  Constitutional:      General: He is not in acute distress.    Appearance: He is not ill-appearing, toxic-appearing or diaphoretic.  HENT:     Head: Normocephalic and atraumatic.     Nose: No congestion or rhinorrhea.  Eyes:     Extraocular Movements: Extraocular movements intact.     Pupils: Pupils are equal, round, and reactive to light.  Neck:     Vascular: No carotid bruit.  Cardiovascular:     Rate and Rhythm: Normal rate and regular rhythm.     Pulses: Normal pulses.     Heart sounds: Normal heart sounds.  Pulmonary:     Breath sounds: Normal breath sounds.  Musculoskeletal:     Right lower leg: No edema.  Left lower leg: No edema.  Lymphadenopathy:     Cervical: No cervical adenopathy.  Skin:    General: Skin is warm and dry.  Neurological:     General: No focal deficit present.     Mental Status: He is oriented to person, place, and time. Mental status is at baseline.     Cranial Nerves: No cranial nerve deficit.     Sensory: No sensory deficit.     Motor: No weakness.     Coordination: Coordination normal.     Gait: Gait normal.     Deep Tendon Reflexes: Reflexes normal.  Psychiatric:        Behavior: Behavior normal.         Thought Content: Thought content normal.     FOLLOW UP   Return if symptoms worsen or fail to improve.   William L. Carolee, MD  Encounter time

## 2024-08-11 NOTE — Progress Notes (Signed)
 Please call the patient regarding his abnormal result. No nsaids hydration  recheck renal 6 mos

## 2024-09-04 NOTE — Progress Notes (Signed)
 Call patient normal result

## 2024-09-04 NOTE — Progress Notes (Signed)
 Call patient normal resultnormal for age  call if symptoms persist

## 2024-11-05 ENCOUNTER — Emergency Department (HOSPITAL_COMMUNITY)

## 2024-11-05 ENCOUNTER — Encounter (HOSPITAL_COMMUNITY): Payer: Self-pay

## 2024-11-05 ENCOUNTER — Encounter (HOSPITAL_COMMUNITY): Admission: EM | Disposition: A | Discharge status: Skilled Nursing Facility | Payer: Self-pay | Source: Home / Self Care

## 2024-11-05 ENCOUNTER — Inpatient Hospital Stay (HOSPITAL_COMMUNITY)

## 2024-11-05 ENCOUNTER — Other Ambulatory Visit: Payer: Self-pay

## 2024-11-05 ENCOUNTER — Inpatient Hospital Stay (HOSPITAL_COMMUNITY)
Admission: EM | Admit: 2024-11-05 | Discharge: 2024-11-26 | DRG: 493 | Disposition: A | Discharge status: Skilled Nursing Facility

## 2024-11-05 DIAGNOSIS — Z7982 Long term (current) use of aspirin: Secondary | ICD-10-CM | POA: Diagnosis not present

## 2024-11-05 DIAGNOSIS — Z8546 Personal history of malignant neoplasm of prostate: Secondary | ICD-10-CM

## 2024-11-05 DIAGNOSIS — I872 Venous insufficiency (chronic) (peripheral): Secondary | ICD-10-CM | POA: Diagnosis present

## 2024-11-05 DIAGNOSIS — Y92488 Other paved roadways as the place of occurrence of the external cause: Secondary | ICD-10-CM

## 2024-11-05 DIAGNOSIS — S82462A Displaced segmental fracture of shaft of left fibula, initial encounter for closed fracture: Secondary | ICD-10-CM | POA: Diagnosis present

## 2024-11-05 DIAGNOSIS — S9782XA Crushing injury of left foot, initial encounter: Secondary | ICD-10-CM | POA: Diagnosis present

## 2024-11-05 DIAGNOSIS — Z9079 Acquired absence of other genital organ(s): Secondary | ICD-10-CM | POA: Diagnosis not present

## 2024-11-05 DIAGNOSIS — N3289 Other specified disorders of bladder: Secondary | ICD-10-CM | POA: Diagnosis present

## 2024-11-05 DIAGNOSIS — D649 Anemia, unspecified: Secondary | ICD-10-CM | POA: Insufficient documentation

## 2024-11-05 DIAGNOSIS — S85152A Other specified injury of anterior tibial artery, left leg, initial encounter: Secondary | ICD-10-CM | POA: Diagnosis present

## 2024-11-05 DIAGNOSIS — D631 Anemia in chronic kidney disease: Secondary | ICD-10-CM | POA: Diagnosis present

## 2024-11-05 DIAGNOSIS — I1310 Hypertensive heart and chronic kidney disease without heart failure, with stage 1 through stage 4 chronic kidney disease, or unspecified chronic kidney disease: Secondary | ICD-10-CM | POA: Diagnosis present

## 2024-11-05 DIAGNOSIS — J449 Chronic obstructive pulmonary disease, unspecified: Secondary | ICD-10-CM | POA: Diagnosis present

## 2024-11-05 DIAGNOSIS — S82872A Displaced pilon fracture of left tibia, initial encounter for closed fracture: Secondary | ICD-10-CM | POA: Diagnosis not present

## 2024-11-05 DIAGNOSIS — Z79899 Other long term (current) drug therapy: Secondary | ICD-10-CM | POA: Diagnosis not present

## 2024-11-05 DIAGNOSIS — K219 Gastro-esophageal reflux disease without esophagitis: Secondary | ICD-10-CM | POA: Diagnosis present

## 2024-11-05 DIAGNOSIS — E875 Hyperkalemia: Secondary | ICD-10-CM | POA: Diagnosis present

## 2024-11-05 DIAGNOSIS — Z751 Person awaiting admission to adequate facility elsewhere: Secondary | ICD-10-CM

## 2024-11-05 DIAGNOSIS — F32A Depression, unspecified: Secondary | ICD-10-CM | POA: Diagnosis present

## 2024-11-05 DIAGNOSIS — S92302A Fracture of unspecified metatarsal bone(s), left foot, initial encounter for closed fracture: Secondary | ICD-10-CM

## 2024-11-05 DIAGNOSIS — D6959 Other secondary thrombocytopenia: Secondary | ICD-10-CM | POA: Diagnosis not present

## 2024-11-05 DIAGNOSIS — D62 Acute posthemorrhagic anemia: Secondary | ICD-10-CM | POA: Diagnosis present

## 2024-11-05 DIAGNOSIS — I10 Essential (primary) hypertension: Secondary | ICD-10-CM | POA: Diagnosis not present

## 2024-11-05 DIAGNOSIS — N1831 Chronic kidney disease, stage 3a: Secondary | ICD-10-CM | POA: Diagnosis present

## 2024-11-05 DIAGNOSIS — S82892A Other fracture of left lower leg, initial encounter for closed fracture: Secondary | ICD-10-CM | POA: Diagnosis present

## 2024-11-05 DIAGNOSIS — Z9359 Other cystostomy status: Secondary | ICD-10-CM

## 2024-11-05 DIAGNOSIS — L299 Pruritus, unspecified: Secondary | ICD-10-CM | POA: Diagnosis present

## 2024-11-05 DIAGNOSIS — I7092 Chronic total occlusion of artery of the extremities: Secondary | ICD-10-CM | POA: Diagnosis present

## 2024-11-05 DIAGNOSIS — N179 Acute kidney failure, unspecified: Secondary | ICD-10-CM | POA: Diagnosis present

## 2024-11-05 DIAGNOSIS — L89151 Pressure ulcer of sacral region, stage 1: Secondary | ICD-10-CM | POA: Diagnosis not present

## 2024-11-05 DIAGNOSIS — E871 Hypo-osmolality and hyponatremia: Secondary | ICD-10-CM | POA: Diagnosis present

## 2024-11-05 HISTORY — PX: EXTERNAL FIXATION, ANKLE: SHX7570

## 2024-11-05 LAB — CBC
HCT: 38.3 % — ABNORMAL LOW (ref 39.0–52.0)
Hemoglobin: 12 g/dL — ABNORMAL LOW (ref 13.0–17.0)
MCH: 29.8 pg (ref 26.0–34.0)
MCHC: 31.3 g/dL (ref 30.0–36.0)
MCV: 95 fL (ref 80.0–100.0)
Platelets: 143 K/uL — ABNORMAL LOW (ref 150–400)
RBC: 4.03 MIL/uL — ABNORMAL LOW (ref 4.22–5.81)
RDW: 15.5 % (ref 11.5–15.5)
WBC: 11.3 K/uL — ABNORMAL HIGH (ref 4.0–10.5)
nRBC: 0 % (ref 0.0–0.2)

## 2024-11-05 LAB — URINALYSIS, ROUTINE W REFLEX MICROSCOPIC
Bilirubin Urine: NEGATIVE
Glucose, UA: NEGATIVE mg/dL
Hgb urine dipstick: NEGATIVE
Ketones, ur: NEGATIVE mg/dL
Nitrite: POSITIVE — AB
Protein, ur: NEGATIVE mg/dL
Specific Gravity, Urine: 1.011 (ref 1.005–1.030)
WBC, UA: >50 WBC/hpf (ref 0–5)
pH: 5 (ref 5.0–8.0)

## 2024-11-05 LAB — COMPREHENSIVE METABOLIC PANEL WITH GFR
ALT: 16 U/L (ref 0–44)
AST: 23 U/L (ref 15–41)
Albumin: 3.2 g/dL — ABNORMAL LOW (ref 3.5–5.0)
Alkaline Phosphatase: 56 U/L (ref 38–126)
Anion gap: 16 — ABNORMAL HIGH (ref 5–15)
BUN: 14 mg/dL (ref 8–23)
CO2: 22 mmol/L (ref 22–32)
Calcium: 8.6 mg/dL — ABNORMAL LOW (ref 8.9–10.3)
Chloride: 100 mmol/L (ref 98–111)
Creatinine, Ser: 1.17 mg/dL (ref 0.61–1.24)
GFR, Estimated: >60 mL/min (ref >60)
Glucose, Bld: 159 mg/dL — ABNORMAL HIGH (ref 70–99)
Potassium: 3.8 mmol/L (ref 3.5–5.1)
Sodium: 138 mmol/L (ref 135–145)
Total Bilirubin: 0.9 mg/dL (ref 0.0–1.2)
Total Protein: 7.2 g/dL (ref 6.5–8.1)

## 2024-11-05 LAB — I-STAT CHEM 8, ED
BUN: 14 mg/dL (ref 8–23)
Calcium, Ion: 1.12 mmol/L — ABNORMAL LOW (ref 1.15–1.40)
Chloride: 103 mmol/L (ref 98–111)
Creatinine, Ser: 1.2 mg/dL (ref 0.61–1.24)
Glucose, Bld: 163 mg/dL — ABNORMAL HIGH (ref 70–99)
HCT: 37 % — ABNORMAL LOW (ref 39.0–52.0)
Hemoglobin: 12.6 g/dL — ABNORMAL LOW (ref 13.0–17.0)
Potassium: 3.8 mmol/L (ref 3.5–5.1)
Sodium: 142 mmol/L (ref 135–145)
TCO2: 24 mmol/L (ref 22–32)

## 2024-11-05 LAB — PROTIME-INR
INR: 0.8 (ref 0.8–1.2)
Prothrombin Time: 11.1 s — ABNORMAL LOW (ref 11.4–15.2)

## 2024-11-05 LAB — SURGICAL PCR SCREEN
MRSA, PCR: NEGATIVE
Staphylococcus aureus: POSITIVE — AB

## 2024-11-05 LAB — SAMPLE TO BLOOD BANK

## 2024-11-05 LAB — I-STAT CG4 LACTIC ACID, ED: Lactic Acid, Venous: 2.1 mmol/L (ref 0.5–1.9)

## 2024-11-05 LAB — ETHANOL: Alcohol, Ethyl (B): <15 mg/dL (ref <15)

## 2024-11-05 SURGERY — EXTERNAL FIXATION, ANKLE
Anesthesia: General | Site: Ankle | Laterality: Left

## 2024-11-05 MED ORDER — DEXAMETHASONE SOD PHOSPHATE PF 10 MG/ML IJ SOLN
INTRAMUSCULAR | Status: DC | PRN
  Administered 2024-11-05: 5 mg via INTRAVENOUS

## 2024-11-05 MED ORDER — CHLORHEXIDINE GLUCONATE CLOTH 2 % EX PADS
6.0000 | MEDICATED_PAD | Freq: Every day | CUTANEOUS | Status: AC
  Administered 2024-11-05 – 2024-11-09 (×5): 6 via TOPICAL

## 2024-11-05 MED ORDER — PHENYLEPHRINE HCL-NACL 20-0.9 MG/250ML-% IV SOLN
INTRAVENOUS | Status: DC | PRN
  Administered 2024-11-05: 30 ug/min via INTRAVENOUS

## 2024-11-05 MED ORDER — ONDANSETRON HCL 4 MG/2ML IJ SOLN
INTRAMUSCULAR | Status: DC | PRN
  Administered 2024-11-05: 4 mg via INTRAVENOUS

## 2024-11-05 MED ORDER — ACETAMINOPHEN 325 MG PO TABS
650.0000 mg | ORAL_TABLET | Freq: Four times a day (QID) | ORAL | Status: DC
  Administered 2024-11-05 – 2024-11-07 (×6): 650 mg via ORAL
  Filled 2024-11-05 (×6): qty 2

## 2024-11-05 MED ORDER — PHENYLEPHRINE 80 MCG/ML (10ML) SYRINGE FOR IV PUSH (FOR BLOOD PRESSURE SUPPORT)
PREFILLED_SYRINGE | INTRAVENOUS | Status: AC
  Filled 2024-11-05: qty 10

## 2024-11-05 MED ORDER — PHENYLEPHRINE 80 MCG/ML (10ML) SYRINGE FOR IV PUSH (FOR BLOOD PRESSURE SUPPORT)
PREFILLED_SYRINGE | INTRAVENOUS | Status: DC | PRN
  Administered 2024-11-05 (×2): 160 ug via INTRAVENOUS

## 2024-11-05 MED ORDER — OXYCODONE HCL 5 MG/5ML PO SOLN: 5.0000 mg | Freq: Once | ORAL | Status: DC | PRN

## 2024-11-05 MED ORDER — AMISULPRIDE (ANTIEMETIC) 5 MG/2ML IV SOLN: 10.0000 mg | Freq: Once | INTRAVENOUS | Status: DC | PRN

## 2024-11-05 MED ORDER — LIDOCAINE 2% (20 MG/ML) 5 ML SYRINGE
INTRAMUSCULAR | Status: AC
  Filled 2024-11-05: qty 5

## 2024-11-05 MED ORDER — ALBUTEROL SULFATE HFA 108 (90 BASE) MCG/ACT IN AERS
INHALATION_SPRAY | RESPIRATORY_TRACT | Status: AC
  Filled 2024-11-05: qty 13.4

## 2024-11-05 MED ORDER — 0.9 % SODIUM CHLORIDE (POUR BTL) OPTIME
TOPICAL | Status: DC | PRN
  Administered 2024-11-05: 1000 mL

## 2024-11-05 MED ORDER — FENTANYL CITRATE (PF) 100 MCG/2ML IJ SOLN
INTRAMUSCULAR | Status: AC
  Filled 2024-11-05: qty 2

## 2024-11-05 MED ORDER — ENOXAPARIN SODIUM 80 MG/0.8ML IJ SOSY
65.0000 mg | PREFILLED_SYRINGE | INTRAMUSCULAR | Status: DC
  Administered 2024-11-06: 65 mg via SUBCUTANEOUS
  Filled 2024-11-05: qty 0.65

## 2024-11-05 MED ORDER — FENTANYL CITRATE (PF) 100 MCG/2ML IJ SOLN
25.0000 ug | INTRAMUSCULAR | Status: DC | PRN
  Administered 2024-11-05 (×4): 25 ug via INTRAVENOUS

## 2024-11-05 MED ORDER — FENTANYL CITRATE (PF) 250 MCG/5ML IJ SOLN
INTRAMUSCULAR | Status: DC | PRN
  Administered 2024-11-05 (×2): 50 ug via INTRAVENOUS

## 2024-11-05 MED ORDER — PROPOFOL 10 MG/ML IV BOLUS
INTRAVENOUS | Status: DC | PRN
  Administered 2024-11-05: 150 mg via INTRAVENOUS
  Administered 2024-11-05: 120 ug/kg/min via INTRAVENOUS
  Administered 2024-11-05: 20 mg via INTRAVENOUS
  Administered 2024-11-05: 30 mg via INTRAVENOUS

## 2024-11-05 MED ORDER — LIDOCAINE 2% (20 MG/ML) 5 ML SYRINGE
INTRAMUSCULAR | Status: DC | PRN
  Administered 2024-11-05: 100 mg via INTRAVENOUS

## 2024-11-05 MED ORDER — IOHEXOL 350 MG/ML SOLN
100.0000 mL | Freq: Once | INTRAVENOUS | Status: AC | PRN
  Administered 2024-11-05: 100 mL via INTRAVENOUS

## 2024-11-05 MED ORDER — OXYCODONE HCL 5 MG PO TABS: 5.0000 mg | ORAL_TABLET | Freq: Once | ORAL | Status: DC | PRN

## 2024-11-05 MED ORDER — FENTANYL CITRATE (PF) 250 MCG/5ML IJ SOLN
INTRAMUSCULAR | Status: AC
  Filled 2024-11-05: qty 5

## 2024-11-05 MED ORDER — CHLORHEXIDINE GLUCONATE 0.12 % MT SOLN
OROMUCOSAL | Status: AC
  Administered 2024-11-05: 15 mL via OROMUCOSAL
  Filled 2024-11-05: qty 15

## 2024-11-05 MED ORDER — ROCURONIUM BROMIDE 10 MG/ML (PF) SYRINGE
PREFILLED_SYRINGE | INTRAVENOUS | Status: AC
Start: 2024-11-05 — End: 2024-11-05
  Filled 2024-11-05: qty 10

## 2024-11-05 MED ORDER — ONDANSETRON HCL 4 MG/2ML IJ SOLN: 4.0000 mg | Freq: Once | INTRAMUSCULAR | Status: DC | PRN

## 2024-11-05 MED ORDER — SUCCINYLCHOLINE CHLORIDE 200 MG/10ML IV SOSY
PREFILLED_SYRINGE | INTRAVENOUS | Status: DC | PRN
  Administered 2024-11-05: 120 mg via INTRAVENOUS

## 2024-11-05 MED ORDER — ORAL CARE MOUTH RINSE: 15.0000 mL | Freq: Once | OROMUCOSAL | Status: AC

## 2024-11-05 MED ORDER — METHOCARBAMOL 500 MG PO TABS
500.0000 mg | ORAL_TABLET | Freq: Three times a day (TID) | ORAL | Status: DC | PRN
  Administered 2024-11-05 – 2024-11-26 (×21): 500 mg via ORAL
  Filled 2024-11-05 (×21): qty 1

## 2024-11-05 MED ORDER — CEFAZOLIN SODIUM-DEXTROSE 2-4 GM/100ML-% IV SOLN
INTRAVENOUS | Status: AC
  Filled 2024-11-05: qty 100

## 2024-11-05 MED ORDER — OXYCODONE HCL 5 MG PO TABS
5.0000 mg | ORAL_TABLET | ORAL | Status: DC | PRN
  Administered 2024-11-05 – 2024-11-06 (×3): 10 mg via ORAL
  Filled 2024-11-05 (×3): qty 2

## 2024-11-05 MED ORDER — EPHEDRINE SULFATE-NACL 50-0.9 MG/10ML-% IV SOSY
PREFILLED_SYRINGE | INTRAVENOUS | Status: DC | PRN
  Administered 2024-11-05: 7.5 mg via INTRAVENOUS
  Administered 2024-11-05: 5 mg via INTRAVENOUS

## 2024-11-05 MED ORDER — CEFAZOLIN SODIUM-DEXTROSE 2-3 GM-%(50ML) IV SOLR
INTRAVENOUS | Status: DC | PRN
  Administered 2024-11-05: 3 g via INTRAVENOUS

## 2024-11-05 MED ORDER — ONDANSETRON HCL 4 MG/2ML IJ SOLN
INTRAMUSCULAR | Status: AC
  Filled 2024-11-05: qty 2

## 2024-11-05 MED ORDER — FENTANYL CITRATE (PF) 50 MCG/ML IJ SOSY
50.0000 ug | PREFILLED_SYRINGE | Freq: Once | INTRAMUSCULAR | Status: AC
  Administered 2024-11-05: 50 ug via INTRAVENOUS
  Filled 2024-11-05: qty 1

## 2024-11-05 MED ORDER — ROCURONIUM BROMIDE 10 MG/ML (PF) SYRINGE
PREFILLED_SYRINGE | INTRAVENOUS | Status: DC | PRN
  Administered 2024-11-05: 5 mg via INTRAVENOUS
  Administered 2024-11-05: 30 mg via INTRAVENOUS

## 2024-11-05 MED ORDER — ALBUMIN HUMAN 5 % IV SOLN: INTRAVENOUS | Status: DC | PRN

## 2024-11-05 MED ORDER — MUPIROCIN 2 % EX OINT
1.0000 | TOPICAL_OINTMENT | Freq: Two times a day (BID) | CUTANEOUS | Status: AC
  Administered 2024-11-05 – 2024-11-10 (×10): 1 via NASAL
  Filled 2024-11-05 (×3): qty 22

## 2024-11-05 MED ORDER — LACTATED RINGERS IV SOLN: INTRAVENOUS | Status: DC

## 2024-11-05 MED ORDER — ESCITALOPRAM OXALATE 10 MG PO TABS
20.0000 mg | ORAL_TABLET | Freq: Every day | ORAL | Status: DC
  Administered 2024-11-06 – 2024-11-26 (×21): 20 mg via ORAL
  Filled 2024-11-05: qty 2
  Filled 2024-11-05 (×5): qty 1
  Filled 2024-11-05 (×2): qty 2
  Filled 2024-11-05 (×2): qty 1
  Filled 2024-11-05 (×3): qty 2
  Filled 2024-11-05 (×2): qty 1
  Filled 2024-11-05: qty 2
  Filled 2024-11-05 (×2): qty 1
  Filled 2024-11-05 (×2): qty 2
  Filled 2024-11-05 (×3): qty 1
  Filled 2024-11-05 (×4): qty 2
  Filled 2024-11-05: qty 1
  Filled 2024-11-05 (×4): qty 2
  Filled 2024-11-05 (×3): qty 1
  Filled 2024-11-05: qty 2
  Filled 2024-11-05: qty 1
  Filled 2024-11-05: qty 2
  Filled 2024-11-05: qty 1

## 2024-11-05 MED ORDER — PROPOFOL 10 MG/ML IV BOLUS
INTRAVENOUS | Status: AC
  Filled 2024-11-05: qty 20

## 2024-11-05 MED ORDER — ACETAMINOPHEN 10 MG/ML IV SOLN: 1000.0000 mg | Freq: Once | INTRAVENOUS | Status: DC | PRN

## 2024-11-05 MED ORDER — EPHEDRINE 5 MG/ML INJ
INTRAVENOUS | Status: AC
  Filled 2024-11-05: qty 5

## 2024-11-05 MED ORDER — CEFAZOLIN SODIUM-DEXTROSE 2-4 GM/100ML-% IV SOLN
2.0000 g | Freq: Three times a day (TID) | INTRAVENOUS | Status: AC
  Administered 2024-11-05 – 2024-11-06 (×3): 2 g via INTRAVENOUS
  Filled 2024-11-05 (×3): qty 100

## 2024-11-05 MED ORDER — CHLORHEXIDINE GLUCONATE 0.12 % MT SOLN: 15.0000 mL | Freq: Once | OROMUCOSAL | Status: AC

## 2024-11-05 MED ORDER — OXYBUTYNIN CHLORIDE 5 MG PO TABS: 5.0000 mg | ORAL_TABLET | Freq: Three times a day (TID) | ORAL | Status: DC | PRN

## 2024-11-05 MED ORDER — OXYCODONE HCL 5 MG PO TABS
5.0000 mg | ORAL_TABLET | Freq: Four times a day (QID) | ORAL | Status: DC | PRN
  Administered 2024-11-05: 10 mg via ORAL
  Filled 2024-11-05: qty 2

## 2024-11-05 MED ORDER — SUGAMMADEX SODIUM 200 MG/2ML IV SOLN
INTRAVENOUS | Status: DC | PRN
  Administered 2024-11-05 (×3): 200 mg via INTRAVENOUS

## 2024-11-05 SURGICAL SUPPLY — 52 items
BAG COUNTER SPONGE SURGICOUNT (BAG) ×1 IMPLANT
BAR GLASS FIBER EXFX 11X150 (EXFIX) IMPLANT
BAR GLASS FIBER EXFX 11X400 (EXFIX) IMPLANT
BNDG COHESIVE 6X5 TAN ST LF (GAUZE/BANDAGES/DRESSINGS) IMPLANT
BNDG COMPR ESMARK 6X3 LF (GAUZE/BANDAGES/DRESSINGS) ×1 IMPLANT
BNDG ELASTIC 4INX 5YD STR LF (GAUZE/BANDAGES/DRESSINGS) IMPLANT
BNDG ELASTIC 4X5.8 VLCR STR LF (GAUZE/BANDAGES/DRESSINGS) ×1 IMPLANT
BNDG ELASTIC 6INX 5YD STR LF (GAUZE/BANDAGES/DRESSINGS) ×1 IMPLANT
BNDG GAUZE DERMACEA FLUFF 4 (GAUZE/BANDAGES/DRESSINGS) ×2 IMPLANT
BRUSH SCRUB EZ 4% CHG (MISCELLANEOUS) ×1 IMPLANT
BRUSH SCRUB EZ PLAIN DRY (MISCELLANEOUS) ×2 IMPLANT
CLAMP BLUE BAR TO BAR (EXFIX) IMPLANT
CLAMP BLUE BAR TO PIN (EXFIX) IMPLANT
COVER SURGICAL LIGHT HANDLE (MISCELLANEOUS) ×2 IMPLANT
CUFF TOURN SGL QUICK 18X4 (TOURNIQUET CUFF) IMPLANT
DRAPE C-ARM 42X72 X-RAY (DRAPES) IMPLANT
DRAPE C-ARMOR (DRAPES) ×1 IMPLANT
DRAPE U-SHAPE 47X51 STRL (DRAPES) ×1 IMPLANT
DRSG ADAPTIC 3X8 NADH LF (GAUZE/BANDAGES/DRESSINGS) ×1 IMPLANT
DRSG MEPITEL 4X7.2 (GAUZE/BANDAGES/DRESSINGS) IMPLANT
ELECT PENCIL ROCKER SW 15FT (MISCELLANEOUS) IMPLANT
ELECTRODE REM PT RTRN 9FT ADLT (ELECTROSURGICAL) ×1 IMPLANT
EVACUATOR 1/8 PVC DRAIN (DRAIN) IMPLANT
GAUZE SPONGE 4X4 12PLY STRL (GAUZE/BANDAGES/DRESSINGS) ×1 IMPLANT
GLOVE BIO SURGEON STRL SZ8 (GLOVE) ×2 IMPLANT
GLOVE BIOGEL PI IND STRL 7.5 (GLOVE) ×1 IMPLANT
GLOVE BIOGEL PI IND STRL 8 (GLOVE) ×1 IMPLANT
GLOVE SURG ORTHO LTX SZ7.5 (GLOVE) ×2 IMPLANT
GOWN STRL REUS W/ TWL LRG LVL3 (GOWN DISPOSABLE) ×2 IMPLANT
GOWN STRL REUS W/ TWL XL LVL3 (GOWN DISPOSABLE) ×1 IMPLANT
HALF PIN 3MM (EXFIX) IMPLANT
KIT BASIN OR (CUSTOM PROCEDURE TRAY) ×1 IMPLANT
KIT TURNOVER KIT B (KITS) ×1 IMPLANT
MANIFOLD NEPTUNE II (INSTRUMENTS) ×1 IMPLANT
NDL 22X1.5 STRL (OR ONLY) (MISCELLANEOUS) IMPLANT
NEEDLE 22X1.5 STRL (OR ONLY) (MISCELLANEOUS) IMPLANT
PACK ORTHO EXTREMITY (CUSTOM PROCEDURE TRAY) ×1 IMPLANT
PAD ARMBOARD POSITIONER FOAM (MISCELLANEOUS) ×2 IMPLANT
PADDING CAST COTTON 6X4 STRL (CAST SUPPLIES) ×2 IMPLANT
PADDING CAST SYNTHETIC 4X4 STR (CAST SUPPLIES) IMPLANT
PIN 4X100X20MM (EXFIX) IMPLANT
PIN CLAMP 2BAR 75MM BLUE (EXFIX) IMPLANT
PIN HALF YELLOW 5X160X35 (EXFIX) IMPLANT
PIN TRANSFIXING 5.0 (EXFIX) IMPLANT
SOLN 0.9% NACL POUR BTL 1000ML (IV SOLUTION) ×1 IMPLANT
SPONGE T-LAP 18X18 ~~LOC~~+RFID (SPONGE) ×1 IMPLANT
STAPLER SKIN PROX 35W (STAPLE) IMPLANT
STOCKINETTE IMPERVIOUS LG (DRAPES) IMPLANT
STRIP CLOSURE SKIN 1/2X4 (GAUZE/BANDAGES/DRESSINGS) IMPLANT
TOWEL GREEN STERILE (TOWEL DISPOSABLE) ×2 IMPLANT
TOWEL GREEN STERILE FF (TOWEL DISPOSABLE) ×2 IMPLANT
UNDERPAD 30X36 HEAVY ABSORB (UNDERPADS AND DIAPERS) ×1 IMPLANT

## 2024-11-05 NOTE — Anesthesia Procedure Notes (Signed)
 Procedure Name: Intubation Date/Time: 11/05/2024 11:34 AM  Performed by: Roslynn Waddell LABOR, CRNAPre-anesthesia Checklist: Patient identified, Emergency Drugs available, Suction available and Patient being monitored Patient Re-evaluated:Patient Re-evaluated prior to induction Oxygen Delivery Method: Circle System Utilized Preoxygenation: Pre-oxygenation with 100% oxygen Induction Type: IV induction Laryngoscope Size: Mac and 4 Grade View: Grade I Tube type: Oral Tube size: 7.5 mm Number of attempts: 1 Airway Equipment and Method: Stylet and Oral airway Placement Confirmation: ETT inserted through vocal cords under direct vision, positive ETCO2 and breath sounds checked- equal and bilateral Secured at: 23 cm Tube secured with: Tape Dental Injury: Teeth and Oropharynx as per pre-operative assessment  Comments: RSI d/t not fully NPO. Atraumatic induction/intubation. Dentition and oral mucosa as per preop (patient edentulous upper and lower)
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
## 2024-11-05 NOTE — ED Notes (Signed)
 2 GOLD TOPS ALSO SENT TO MAIN LAB
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
## 2024-11-05 NOTE — Transfer of Care (Signed)
 Immediate Anesthesia Transfer of Care Note  Patient: Blake Cruz  Procedure(s) Performed: EXTERNAL FIXATION OF LEFT FOOT AND ANKLE (Left: Ankle)  Patient Location: PACU  Anesthesia Type:General  Level of Consciousness: awake and alert   Airway & Oxygen Therapy: Patient Spontanous Breathing and Patient connected to face mask oxygen  Post-op Assessment: Report given to RN and Post -op Vital signs reviewed and stable  Post vital signs: Reviewed and stable  Last Vitals:  Vitals Value Taken Time  BP 157/76 11/05/24 13:45  Temp    Pulse 97 11/05/24 13:49  Resp 18 11/05/24 13:49  SpO2 96 % 11/05/24 13:49  Vitals shown include unfiled device data.  Last Pain:  Vitals:   11/05/24 1047  TempSrc: Oral  PainSc: 8          Complications: No notable events documented.

## 2024-11-05 NOTE — Consult Note (Signed)
 Patient ID: Blake Cruz, adult   DOB: 1941-05-30, 83 y.o.   MRN: [id number]  Called intraoperatively to evaluate patient's left lower extremity perfusion in setting of complex orthopedic injury.  CT angiogram was performed and shows anterior tibial artery occlusion.  Dr. Celena, the patient's operative orthopedist was having trouble finding signal in the foot and asked for me to evaluate the foot with him.  An external fixator is in place on my presentation in the operating room.  Together we reviewed the CT scan.  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  He has a triphasic posterior tibial and peroneal artery signal on exam.  His foot is well-perfused.  We will be available as needed for any issues.  Debby SAILOR. Magda, MD [facility] Phone Number: [phone] 11/05/2024 1:05 PM
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
## 2024-11-05 NOTE — Anesthesia Postprocedure Evaluation (Signed)
 Anesthesia Post Note  Patient: Blake Cruz, Blake (it)  Procedure(s) Performed: EXTERNAL FIXATION OF LEFT FOOT AND ANKLE (Left: Ankle)     Patient location during evaluation: PACU Anesthesia Type: General Level of consciousness: awake Pain management: pain level controlled Vital Signs Assessment: post-procedure vital signs reviewed and stable Respiratory status: nonlabored ventilation and spontaneous breathing Cardiovascular status: blood pressure returned to baseline Postop Assessment: no apparent nausea or vomiting Anesthetic complications: no   No notable events documented.                Lauraine KATHEE [place]
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
## 2024-11-05 NOTE — TOC CAGE-AID Note (Signed)
 Transition of Care [facility]) - CAGE-AID Screening  Patient Details  Name: Blake Cruz MRN: [id number] Date of Birth: [birth year]  Clinical Narrative:  Patient denies any current alcohol or drug use. No substance abuse resources needed at this time.  CAGE-AID Screening:   Have You Ever Felt You Ought to Cut Down on Your Drinking or Drug Use?: No Have People Annoyed You By Critizing Your Drinking Or Drug Use?: No Have You Felt Bad Or Guilty About Your Drinking Or Drug Use?: No Have You Ever Had a Drink or Used Drugs First Thing In The Morning to Steady Your Nerves or to Get Rid of a Hangover?: No CAGE-AID Score: 0  Substance Abuse Education Offered: No

## 2024-11-05 NOTE — Plan of Care (Signed)
  Problem: Education: Goal: Knowledge of General Education information will improve Description: Including pain rating scale, medication(s)/side effects and non-pharmacologic comfort measures 11/05/2024 1655 by Eduardo Sandrea LABOR, RN Outcome: Progressing 11/05/2024 1655 by Eduardo Sandrea LABOR, RN Outcome: Progressing   Problem: Health Behavior/Discharge Planning: Goal: Ability to manage health-related needs will improve 11/05/2024 1655 by Eduardo Sandrea LABOR, RN Outcome: Progressing 11/05/2024 1655 by Eduardo Sandrea LABOR, RN Outcome: Progressing   Problem: Clinical Measurements: Goal: Ability to maintain clinical measurements within normal limits will improve 11/05/2024 1655 by Eduardo Sandrea LABOR, RN Outcome: Progressing 11/05/2024 1655 by Eduardo Sandrea LABOR, RN Outcome: Progressing Goal: Will remain free from infection 11/05/2024 1655 by Eduardo Sandrea LABOR, RN Outcome: Progressing 11/05/2024 1655 by Eduardo Sandrea LABOR, RN Outcome: Progressing Goal: Diagnostic test results will improve 11/05/2024 1655 by Eduardo Sandrea LABOR, RN Outcome: Progressing 11/05/2024 1655 by Eduardo Sandrea LABOR, RN Outcome: Progressing Goal: Respiratory complications will improve 11/05/2024 1655 by Eduardo Sandrea LABOR, RN Outcome: Progressing 11/05/2024 1655 by Eduardo Sandrea LABOR, RN Outcome: Progressing Goal: Cardiovascular complication will be avoided 11/05/2024 1655 by Eduardo Sandrea LABOR, RN Outcome: Progressing 11/05/2024 1655 by Eduardo Sandrea LABOR, RN Outcome: Progressing   Problem: Clinical Measurements: Goal: Will remain free from infection 11/05/2024 1655 by Eduardo Sandrea LABOR, RN Outcome: Progressing 11/05/2024 1655 by Eduardo Sandrea LABOR, RN Outcome: Progressing   Problem: Clinical Measurements: Goal: Cardiovascular complication will be avoided 11/05/2024 1655 by Eduardo Sandrea LABOR, RN Outcome: Progressing 11/05/2024 1655 by Eduardo Sandrea LABOR, RN Outcome: Progressing   Problem: Activity: Goal: Risk for activity  intolerance will decrease 11/05/2024 1655 by Eduardo Sandrea LABOR, RN Outcome: Progressing 11/05/2024 1655 by Eduardo Sandrea A, RN Outcome: Progressing   Problem: Pain Managment: Goal: General experience of comfort will improve and/or be controlled 11/05/2024 1655 by Eduardo Sandrea LABOR, RN Outcome: Progressing 11/05/2024 1655 by Eduardo Sandrea LABOR, RN Outcome: Progressing   Problem: Elimination: Goal: Will not experience complications related to bowel motility 11/05/2024 1655 by Eduardo Sandrea LABOR, RN Outcome: Progressing 11/05/2024 1655 by Eduardo Sandrea LABOR, RN Outcome: Progressing Goal: Will not experience complications related to urinary retention 11/05/2024 1655 by Eduardo Sandrea LABOR, RN Outcome: Progressing 11/05/2024 1655 by Eduardo Sandrea LABOR, RN Outcome: Progressing   Problem: Skin Integrity: Goal: Risk for impaired skin integrity will decrease 11/05/2024 1655 by Eduardo Sandrea LABOR, RN Outcome: Progressing 11/05/2024 1655 by Eduardo Sandrea LABOR, RN Outcome: Progressing

## 2024-11-06 ENCOUNTER — Encounter (HOSPITAL_COMMUNITY): Payer: Self-pay | Admitting: Orthopedic Surgery

## 2024-11-06 DIAGNOSIS — S82872A Displaced pilon fracture of left tibia, initial encounter for closed fracture: Secondary | ICD-10-CM | POA: Diagnosis present

## 2024-11-06 DIAGNOSIS — Z7901 Long term (current) use of anticoagulants: Secondary | ICD-10-CM

## 2024-11-06 DIAGNOSIS — Z960 Presence of urogenital implants: Secondary | ICD-10-CM

## 2024-11-06 DIAGNOSIS — R8281 Pyuria: Secondary | ICD-10-CM

## 2024-11-06 DIAGNOSIS — S82462A Displaced segmental fracture of shaft of left fibula, initial encounter for closed fracture: Secondary | ICD-10-CM | POA: Diagnosis not present

## 2024-11-06 DIAGNOSIS — D649 Anemia, unspecified: Secondary | ICD-10-CM

## 2024-11-06 DIAGNOSIS — R8271 Bacteriuria: Secondary | ICD-10-CM

## 2024-11-06 DIAGNOSIS — J9811 Atelectasis: Secondary | ICD-10-CM

## 2024-11-06 DIAGNOSIS — S93325A Dislocation of tarsometatarsal joint of left foot, initial encounter: Secondary | ICD-10-CM | POA: Diagnosis not present

## 2024-11-06 DIAGNOSIS — J449 Chronic obstructive pulmonary disease, unspecified: Secondary | ICD-10-CM

## 2024-11-06 DIAGNOSIS — Z87440 Personal history of urinary (tract) infections: Secondary | ICD-10-CM

## 2024-11-06 DIAGNOSIS — Z8546 Personal history of malignant neoplasm of prostate: Secondary | ICD-10-CM

## 2024-11-06 DIAGNOSIS — I70209 Unspecified atherosclerosis of native arteries of extremities, unspecified extremity: Secondary | ICD-10-CM

## 2024-11-06 DIAGNOSIS — I10 Essential (primary) hypertension: Secondary | ICD-10-CM

## 2024-11-06 DIAGNOSIS — I872 Venous insufficiency (chronic) (peripheral): Secondary | ICD-10-CM

## 2024-11-06 LAB — CBC
HCT: 29.7 % — ABNORMAL LOW (ref 39.0–52.0)
Hemoglobin: 9.6 g/dL — ABNORMAL LOW (ref 13.0–17.0)
MCH: 29.9 pg (ref 26.0–34.0)
MCHC: 32.3 g/dL (ref 30.0–36.0)
MCV: 92.5 fL (ref 80.0–100.0)
Platelets: 124 K/uL — ABNORMAL LOW (ref 150–400)
RBC: 3.21 MIL/uL — ABNORMAL LOW (ref 4.22–5.81)
RDW: 15.9 % — ABNORMAL HIGH (ref 11.5–15.5)
WBC: 10.1 K/uL (ref 4.0–10.5)
nRBC: 0 % (ref 0.0–0.2)

## 2024-11-06 MED ORDER — PANTOPRAZOLE SODIUM 40 MG PO TBEC: 40.0000 mg | DELAYED_RELEASE_TABLET | Freq: Every day | ORAL | Status: DC | PRN

## 2024-11-06 MED ORDER — SENNOSIDES-DOCUSATE SODIUM 8.6-50 MG PO TABS
1.0000 | ORAL_TABLET | Freq: Every day | ORAL | Status: DC | PRN
  Administered 2024-11-08: 1 via ORAL
  Filled 2024-11-06: qty 1

## 2024-11-06 MED ORDER — OXYCODONE HCL 5 MG PO TABS
5.0000 mg | ORAL_TABLET | ORAL | Status: DC | PRN
  Administered 2024-11-06 – 2024-11-07 (×5): 5 mg via ORAL
  Filled 2024-11-06 (×5): qty 1

## 2024-11-06 MED ORDER — POLYETHYLENE GLYCOL 3350 17 G PO PACK
17.0000 g | PACK | Freq: Every day | ORAL | Status: DC
  Administered 2024-11-06 – 2024-11-17 (×8): 17 g via ORAL
  Filled 2024-11-06 (×12): qty 1

## 2024-11-06 MED ORDER — ENOXAPARIN SODIUM 60 MG/0.6ML IJ SOSY
60.0000 mg | PREFILLED_SYRINGE | INTRAMUSCULAR | Status: DC
  Administered 2024-11-07 – 2024-11-16 (×10): 60 mg via SUBCUTANEOUS
  Filled 2024-11-06 (×10): qty 0.6
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
## 2024-11-06 NOTE — TOC Initial Note (Signed)
 Transition of Care [facility]) - Initial/Assessment Note    Patient Details  Name: Blake Cruz MRN: [id number] Date of Birth: [birth year]  [facility]) CM/SW Contact:    Bridget Cordella Simmonds, LCSW Phone Number: 11/06/2024, 4:02 PM  Clinical Narrative:     CSW met with pt for initial assessment.  Pt very hard of hearing.  Reports he lives at home with wife Dickey and daughter Elijah.   Permission given to speak with them. Attempted to discuss PT recommendations for SNF, pt either not comprehending or unable to hear adequately.  Per notes, pt will return for more surgery next week.  ICM will continue to follow.               Barriers to Discharge: Continued Medical Work up, SNF Pending bed offer   Patient Goals and CMS Choice Patient states their goals for this hospitalization and ongoing recovery are:: pt unable to state goal          Expected Discharge Plan and Services In-house Referral: Clinical Social Work     Living arrangements for the past 2 months: Single Family Home                                      Prior Living Arrangements/Services Living arrangements for the past 2 months: Single Family Home Lives with:: Spouse, Adult Children (daughter Elijah) Patient language and need for interpreter reviewed:: Yes        Need for Family Participation in Patient Care: Yes (Comment) Care giver support system in place?: Yes (comment)   Criminal Activity/Legal Involvement Pertinent to Current Situation/Hospitalization: No - Comment as needed  Activities of Daily Living   ADL Screening (condition at time of admission) Independently performs ADLs?: Yes (appropriate for developmental age) Is the patient deaf or have difficulty hearing?: Yes Does the patient have difficulty seeing, even when wearing glasses/contacts?: No Does the patient have difficulty concentrating, remembering, or making decisions?: No  Permission Sought/Granted Permission sought to  share information with : Family Supports Permission granted to share information with : Yes, Verbal Permission Granted  Share Information with NAME: wife Dickey, daughter Elijah           Emotional Assessment Appearance:: Appears stated age Attitude/Demeanor/Rapport: Engaged Affect (typically observed): Pleasant Orientation: : Oriented to Self, Oriented to Place, Oriented to  Time, Oriented to Situation      Admission diagnosis:  Ankle fracture, left [S82.892A] Patient Active Problem List   Diagnosis Date Noted   Closed left pilon fracture, initial encounter 11/06/2024   Displaced segmental fracture of shaft of left fibula, initial encounter for closed fracture 11/06/2024   Multiple closed fractures of metatarsal bone of left foot 11/05/2024   PCP:  Carolee Fallow, MD Pharmacy:   [facility] [place], [place] - [address] [place] [address] Phone: [phone] Fax: [phone]     Social Drivers of Health (SDOH) Social History: SDOH Screenings   Food Insecurity: Unknown (11/05/2024)  Housing: Low Risk  (11/05/2024)  Transportation Needs: No Transportation Needs (11/05/2024)  Utilities: Not At Risk (11/05/2024)  Financial Resource Strain: Low Risk (10/28/2017)   Received from [facility]  Physical Activity: Inactive (10/28/2017)   Received from [facility]  Social Connections: Unknown (11/06/2024)  Stress: No Stress Concern Present (10/28/2017)   Received from [facility]  Tobacco Use:  Low Risk  (11/05/2024)   SDOH Interventions:     Readmission Risk Interventions     No data to display
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
## 2024-11-06 NOTE — Progress Notes (Signed)
 Patients left leg was reinforced due to draining blood from behind calf area, MD aware, elevated left leg and ice applied.
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
## 2024-11-07 DIAGNOSIS — S93325A Dislocation of tarsometatarsal joint of left foot, initial encounter: Secondary | ICD-10-CM | POA: Diagnosis not present

## 2024-11-07 DIAGNOSIS — S82872A Displaced pilon fracture of left tibia, initial encounter for closed fracture: Secondary | ICD-10-CM | POA: Diagnosis not present

## 2024-11-07 DIAGNOSIS — S82462A Displaced segmental fracture of shaft of left fibula, initial encounter for closed fracture: Secondary | ICD-10-CM | POA: Diagnosis not present

## 2024-11-07 LAB — BASIC METABOLIC PANEL WITH GFR
Anion gap: 10 (ref 5–15)
BUN: 24 mg/dL — ABNORMAL HIGH (ref 8–23)
CO2: 25 mmol/L (ref 22–32)
Calcium: 8.3 mg/dL — ABNORMAL LOW (ref 8.9–10.3)
Chloride: 102 mmol/L (ref 98–111)
Creatinine, Ser: 1.35 mg/dL — ABNORMAL HIGH (ref 0.61–1.24)
GFR, Estimated: 52 mL/min — ABNORMAL LOW (ref >60)
Glucose, Bld: 125 mg/dL — ABNORMAL HIGH (ref 70–99)
Potassium: 4.3 mmol/L (ref 3.5–5.1)
Sodium: 137 mmol/L (ref 135–145)

## 2024-11-07 LAB — CBC
HCT: 27.7 % — ABNORMAL LOW (ref 39.0–52.0)
Hemoglobin: 8.9 g/dL — ABNORMAL LOW (ref 13.0–17.0)
MCH: 29.7 pg (ref 26.0–34.0)
MCHC: 32.1 g/dL (ref 30.0–36.0)
MCV: 92.3 fL (ref 80.0–100.0)
Platelets: 129 K/uL — ABNORMAL LOW (ref 150–400)
RBC: 3 MIL/uL — ABNORMAL LOW (ref 4.22–5.81)
RDW: 15.8 % — ABNORMAL HIGH (ref 11.5–15.5)
WBC: 9.7 K/uL (ref 4.0–10.5)
nRBC: 0.2 % (ref 0.0–0.2)

## 2024-11-07 LAB — VITAMIN D 25 HYDROXY (VIT D DEFICIENCY, FRACTURES): Vit D, 25-Hydroxy: 19.14 ng/mL — ABNORMAL LOW (ref 30–100)

## 2024-11-07 MED ORDER — OXYCODONE HCL 5 MG PO TABS
5.0000 mg | ORAL_TABLET | Freq: Once | ORAL | Status: AC
  Administered 2024-11-07: 5 mg via ORAL
  Filled 2024-11-07: qty 1

## 2024-11-07 MED ORDER — OXYCODONE HCL 5 MG PO TABS
5.0000 mg | ORAL_TABLET | Freq: Four times a day (QID) | ORAL | Status: DC | PRN
  Administered 2024-11-08 – 2024-11-09 (×3): 5 mg via ORAL
  Filled 2024-11-07 (×3): qty 1

## 2024-11-07 MED ORDER — ALBUTEROL SULFATE (2.5 MG/3ML) 0.083% IN NEBU: 3.0000 mL | INHALATION_SOLUTION | Freq: Four times a day (QID) | RESPIRATORY_TRACT | Status: DC | PRN

## 2024-11-07 MED ORDER — ACETAMINOPHEN 500 MG PO TABS
1000.0000 mg | ORAL_TABLET | Freq: Four times a day (QID) | ORAL | Status: DC
  Administered 2024-11-07 – 2024-11-09 (×8): 1000 mg via ORAL
  Filled 2024-11-07 (×8): qty 2

## 2024-11-07 MED ORDER — GABAPENTIN 100 MG PO CAPS
200.0000 mg | ORAL_CAPSULE | Freq: Three times a day (TID) | ORAL | Status: DC
  Administered 2024-11-07 – 2024-11-26 (×57): 200 mg via ORAL
  Filled 2024-11-07 (×58): qty 2

## 2024-11-07 NOTE — Plan of Care (Signed)
  Problem: Clinical Measurements: Goal: Will remain free from infection Outcome: Progressing Goal: Respiratory complications will improve Outcome: Progressing Goal: Cardiovascular complication will be avoided Outcome: Progressing   

## 2024-11-07 NOTE — Plan of Care (Signed)
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
## 2024-11-07 NOTE — Progress Notes (Signed)
 Message sent to Dr. Shawn requesting a IV pain medication.  Advised that the PO medication is not helping with pain management for patient at this time.

## 2024-11-08 DIAGNOSIS — S82462A Displaced segmental fracture of shaft of left fibula, initial encounter for closed fracture: Secondary | ICD-10-CM | POA: Diagnosis not present

## 2024-11-08 DIAGNOSIS — S82872A Displaced pilon fracture of left tibia, initial encounter for closed fracture: Secondary | ICD-10-CM | POA: Diagnosis not present

## 2024-11-08 DIAGNOSIS — S93325A Dislocation of tarsometatarsal joint of left foot, initial encounter: Secondary | ICD-10-CM | POA: Diagnosis not present

## 2024-11-08 DIAGNOSIS — D696 Thrombocytopenia, unspecified: Secondary | ICD-10-CM

## 2024-11-08 LAB — CBC
HCT: 27 % — ABNORMAL LOW (ref 39.0–52.0)
Hemoglobin: 8.7 g/dL — ABNORMAL LOW (ref 13.0–17.0)
MCH: 29.6 pg (ref 26.0–34.0)
MCHC: 32.2 g/dL (ref 30.0–36.0)
MCV: 91.8 fL (ref 80.0–100.0)
Platelets: 141 K/uL — ABNORMAL LOW (ref 150–400)
RBC: 2.94 MIL/uL — ABNORMAL LOW (ref 4.22–5.81)
RDW: 15.8 % — ABNORMAL HIGH (ref 11.5–15.5)
WBC: 8.7 K/uL (ref 4.0–10.5)
nRBC: 0.3 % — ABNORMAL HIGH (ref 0.0–0.2)

## 2024-11-08 LAB — BASIC METABOLIC PANEL WITH GFR
Anion gap: 8 (ref 5–15)
BUN: 23 mg/dL (ref 8–23)
CO2: 25 mmol/L (ref 22–32)
Calcium: 8.3 mg/dL — ABNORMAL LOW (ref 8.9–10.3)
Chloride: 104 mmol/L (ref 98–111)
Creatinine, Ser: 1.16 mg/dL (ref 0.61–1.24)
GFR, Estimated: >60 mL/min (ref >60)
Glucose, Bld: 127 mg/dL — ABNORMAL HIGH (ref 70–99)
Potassium: 4.3 mmol/L (ref 3.5–5.1)
Sodium: 137 mmol/L (ref 135–145)

## 2024-11-08 MED ORDER — VITAMIN D (ERGOCALCIFEROL) 1.25 MG (50000 UNIT) PO CAPS
50000.0000 [IU] | ORAL_CAPSULE | ORAL | Status: DC
  Administered 2024-11-08 – 2024-11-22 (×3): 50000 [IU] via ORAL
  Filled 2024-11-08 (×3): qty 1

## 2024-11-08 NOTE — Plan of Care (Signed)
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
## 2024-11-09 DIAGNOSIS — L139 Bullous disorder, unspecified: Secondary | ICD-10-CM

## 2024-11-09 LAB — BASIC METABOLIC PANEL WITH GFR
Anion gap: 8 (ref 5–15)
BUN: 24 mg/dL — ABNORMAL HIGH (ref 8–23)
CO2: 26 mmol/L (ref 22–32)
Calcium: 8.2 mg/dL — ABNORMAL LOW (ref 8.9–10.3)
Chloride: 103 mmol/L (ref 98–111)
Creatinine, Ser: 1.12 mg/dL (ref 0.61–1.24)
GFR, Estimated: >60 mL/min (ref >60)
Glucose, Bld: 122 mg/dL — ABNORMAL HIGH (ref 70–99)
Potassium: 4.6 mmol/L (ref 3.5–5.1)
Sodium: 137 mmol/L (ref 135–145)

## 2024-11-09 LAB — CBC
HCT: 25.4 % — ABNORMAL LOW (ref 39.0–52.0)
Hemoglobin: 8.2 g/dL — ABNORMAL LOW (ref 13.0–17.0)
MCH: 29.8 pg (ref 26.0–34.0)
MCHC: 32.3 g/dL (ref 30.0–36.0)
MCV: 92.4 fL (ref 80.0–100.0)
Platelets: 146 K/uL — ABNORMAL LOW (ref 150–400)
RBC: 2.75 MIL/uL — ABNORMAL LOW (ref 4.22–5.81)
RDW: 15.7 % — ABNORMAL HIGH (ref 11.5–15.5)
WBC: 7.8 K/uL (ref 4.0–10.5)
nRBC: 0.3 % — ABNORMAL HIGH (ref 0.0–0.2)

## 2024-11-09 MED ORDER — CEFAZOLIN SODIUM-DEXTROSE 2-4 GM/100ML-% IV SOLN
2.0000 g | Freq: Three times a day (TID) | INTRAVENOUS | Status: AC
  Administered 2024-11-09 – 2024-11-16 (×21): 2 g via INTRAVENOUS
  Filled 2024-11-09 (×22): qty 100

## 2024-11-09 MED ORDER — FUROSEMIDE 40 MG PO TABS
40.0000 mg | ORAL_TABLET | Freq: Every day | ORAL | Status: DC
  Administered 2024-11-09 – 2024-11-12 (×3): 40 mg via ORAL
  Filled 2024-11-09 (×4): qty 1

## 2024-11-09 MED ORDER — FUROSEMIDE 10 MG/ML IJ SOLN: 40.0000 mg | Freq: Once | INTRAMUSCULAR | Status: DC
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
## 2024-11-09 NOTE — NC FL2 (Signed)
 [place]  MEDICAID FL2 LEVEL OF CARE FORM     IDENTIFICATION  Patient Name: Blake Cruz Birthdate: 05-May-1941 Sex: male Admission Date (Current Location): 11/05/2024  [place] and [place] Number:  [place] [id number] Facility and Address:  [facility], [address] [place], [place] [address]      Provider Number: [id number]  Attending Physician Name and Address:  Shawn Sick, MD  Relative Name and Phone Number:  Armour, Villanueva Daughter   [phone]    Current Level of Care: Hospital Recommended Level of Care: Skilled Nursing Facility Prior Approval Number:    Date Approved/Denied:   PASRR Number: [id number]  Discharge Plan: SNF    Current Diagnoses: Patient Active Problem List   Diagnosis Date Noted   Closed left pilon fracture, initial encounter 11/06/2024   Displaced segmental fracture of shaft of left fibula, initial encounter for closed fracture 11/06/2024   Multiple closed fractures of metatarsal bone of left foot 11/05/2024    Orientation RESPIRATION BLADDER Height & Weight     Self, Time, Situation, Place  O2  (suprapubic catheter) Weight: (P) 287 lb (130.2 kg) Height:  (P) 6' (182.9 cm)  BEHAVIORAL SYMPTOMS/MOOD NEUROLOGICAL BOWEL NUTRITION STATUS        Diet (see discharge summary)  AMBULATORY STATUS COMMUNICATION OF NEEDS Skin   Total Care Verbally Surgical wounds                       Personal Care Assistance Level of Assistance  Bathing, Feeding, Dressing, Total care Bathing Assistance: Limited assistance Feeding assistance: Limited assistance Dressing Assistance: Maximum assistance Total Care Assistance: Limited assistance   Functional Limitations Info  Sight, Hearing, Speech Sight Info: Adequate Hearing Info: Impaired Speech Info: Adequate    SPECIAL CARE FACTORS FREQUENCY  PT (By licensed PT), OT (By licensed OT)     PT Frequency: 5x week OT Frequency: 5x week            Contractures  Contractures Info: Not present    Additional Factors Info  Code Status, Allergies Code Status Info: full Allergies Info: NKA           Current Medications (11/09/2024):  This is the current hospital active medication list Current Facility-Administered Medications  Medication Dose Route Frequency Provider Last Rate Last Admin   albuterol  (PROVENTIL ) (2.5 MG/3ML) 0.083% nebulizer solution 3 mL  3 mL Inhalation Q6H PRN Waymond Cart, MD       ceFAZolin  (ANCEF ) IVPB 2g/100 mL premix  2 g Intravenous Q8H Deward Eck, PA-C       Chlorhexidine  Gluconate Cloth 2 % PADS 6 each  6 each Topical Daily Shawn Sick, MD   6 each at 11/08/24 9167   enoxaparin  (LOVENOX ) injection 60 mg  60 mg Subcutaneous Q24H Chen, Lydia D, RPH   60 mg at 11/09/24 9081   escitalopram  (LEXAPRO ) tablet 20 mg  20 mg Oral Daily Nooruddin, Saad, MD   20 mg at 11/09/24 9082   furosemide  (LASIX ) tablet 40 mg  40 mg Oral Daily Juberg, Christopher, DO   40 mg at 11/09/24 1038   gabapentin  (NEURONTIN ) capsule 200 mg  200 mg Oral TID Juberg, Christopher, DO   200 mg at 11/09/24 9082   methocarbamol  (ROBAXIN ) tablet 500 mg  500 mg Oral Q8H PRN Deward Eck, PA-C   500 mg at 11/09/24 9082   mupirocin  ointment (BACTROBAN ) 2 % 1 Application  1 Application Nasal BID Shawn Sick, MD   1 Application at  11/09/24 0919   oxybutynin  (DITROPAN ) tablet 5 mg  5 mg Oral Q8H PRN Nooruddin, Saad, MD       oxyCODONE  (Oxy IR/ROXICODONE ) immediate release tablet 5 mg  5 mg Oral Q6H PRN Waymond Cart, MD   5 mg at 11/09/24 1043   pantoprazole  (PROTONIX ) EC tablet 40 mg  40 mg Oral Daily PRN Tan, Dawson, MD       polyethylene glycol (MIRALAX  / GLYCOLAX ) packet 17 g  17 g Oral Daily Shawn Sick, MD   17 g at 11/09/24 9082   senna-docusate (Senokot-S) tablet 1 tablet  1 tablet Oral Daily PRN Shawn Sick, MD   1 tablet at 11/08/24 2100   Vitamin D  (Ergocalciferol ) (DRISDOL ) 1.25 MG (50000 UNIT) capsule 50,000 Units  50,000 Units Oral Q Austin Danton Lauraine DELENA,  PA-C   50,000 Units at 11/08/24 9164     Discharge Medications: Please see discharge summary for a list of discharge medications.  Relevant Imaging Results:  Relevant Lab Results:   Additional Information SSN: [id number]  Bridget Cordella Simmonds, LCSW

## 2024-11-09 NOTE — Care Management Important Message (Signed)
 Important Message  Patient Details  Name: Blake Cruz MRN: [id number] Date of Birth: 02-25-1941   Important Message Given:  Yes - Medicare IM     Thena Devora SHAUNNA Cumming, LCSW 11/09/2024, 10:23 AM
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
## 2024-11-09 NOTE — TOC Progression Note (Signed)
 Transition of Care [facility]) - Progression Note    Patient Details  Name: Blake Cruz MRN: [id number] Date of Birth: 1941-04-11  Transition of [facility]) CM/SW Contact  Bridget Cordella Simmonds, LCSW Phone Number: 11/09/2024, 2:24 PM  Clinical Narrative:   CSW spoke with pt daughter Elijah, she confirmed she is in agreement with plan for SNF, is requesting [facility].    CSW reached out to [facility], referral faxed to [phone].  Referral sent out to additional [facility] options as well.        Barriers to Discharge: Continued Medical Work up, SNF Pending bed offer               Expected Discharge Plan and Services In-house Referral: Clinical Social Work     Living arrangements for the past 2 months: Single Family Home                                       Social Drivers of Health (SDOH) Interventions SDOH Screenings   Food Insecurity: Unknown (11/05/2024)  Housing: Low Risk  (11/05/2024)  Transportation Needs: No Transportation Needs (11/05/2024)  Utilities: Not At Risk (11/05/2024)  Financial Resource Strain: Low Risk (10/28/2017)   Received from [facility]  Physical Activity: Inactive (10/28/2017)   Received from [facility]  Social Connections: Moderately Integrated (11/09/2024)  Stress: No Stress Concern Present (10/28/2017)   Received from [facility]  Tobacco Use: Low Risk  (11/05/2024)    Readmission Risk Interventions     No data to display

## 2024-11-10 DIAGNOSIS — K219 Gastro-esophageal reflux disease without esophagitis: Secondary | ICD-10-CM

## 2024-11-10 DIAGNOSIS — S82872A Displaced pilon fracture of left tibia, initial encounter for closed fracture: Secondary | ICD-10-CM | POA: Diagnosis not present

## 2024-11-10 DIAGNOSIS — Z79899 Other long term (current) drug therapy: Secondary | ICD-10-CM

## 2024-11-10 DIAGNOSIS — S93325A Dislocation of tarsometatarsal joint of left foot, initial encounter: Secondary | ICD-10-CM | POA: Diagnosis not present

## 2024-11-10 DIAGNOSIS — S82462A Displaced segmental fracture of shaft of left fibula, initial encounter for closed fracture: Secondary | ICD-10-CM | POA: Diagnosis not present

## 2024-11-10 DIAGNOSIS — F32A Depression, unspecified: Secondary | ICD-10-CM

## 2024-11-10 LAB — BASIC METABOLIC PANEL WITH GFR
Anion gap: 9 (ref 5–15)
BUN: 23 mg/dL (ref 8–23)
CO2: 23 mmol/L (ref 22–32)
Calcium: 8.2 mg/dL — ABNORMAL LOW (ref 8.9–10.3)
Chloride: 103 mmol/L (ref 98–111)
Creatinine, Ser: 1.17 mg/dL (ref 0.61–1.24)
GFR, Estimated: >60 mL/min (ref >60)
Glucose, Bld: 166 mg/dL — ABNORMAL HIGH (ref 70–99)
Potassium: 4 mmol/L (ref 3.5–5.1)
Sodium: 135 mmol/L (ref 135–145)

## 2024-11-10 LAB — CBC
HCT: 25.8 % — ABNORMAL LOW (ref 39.0–52.0)
Hemoglobin: 8.2 g/dL — ABNORMAL LOW (ref 13.0–17.0)
MCH: 29.5 pg (ref 26.0–34.0)
MCHC: 31.8 g/dL (ref 30.0–36.0)
MCV: 92.8 fL (ref 80.0–100.0)
Platelets: 177 K/uL (ref 150–400)
RBC: 2.78 MIL/uL — ABNORMAL LOW (ref 4.22–5.81)
RDW: 15.6 % — ABNORMAL HIGH (ref 11.5–15.5)
WBC: 8.9 K/uL (ref 4.0–10.5)
nRBC: 0.6 % — ABNORMAL HIGH (ref 0.0–0.2)

## 2024-11-10 MED ORDER — OXYCODONE HCL 5 MG PO TABS: 5.0000 mg | ORAL_TABLET | Freq: Two times a day (BID) | ORAL | Status: DC | PRN

## 2024-11-10 MED ORDER — FUROSEMIDE 10 MG/ML IJ SOLN
40.0000 mg | Freq: Once | INTRAMUSCULAR | Status: AC
  Administered 2024-11-10: 40 mg via INTRAVENOUS
  Filled 2024-11-10: qty 4

## 2024-11-10 MED ORDER — ACETAMINOPHEN 500 MG PO TABS
1000.0000 mg | ORAL_TABLET | Freq: Four times a day (QID) | ORAL | Status: DC
  Administered 2024-11-10 – 2024-11-15 (×18): 1000 mg via ORAL
  Filled 2024-11-10 (×19): qty 2
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
## 2024-11-10 NOTE — Plan of Care (Signed)
   Problem: Education: Goal: Knowledge of General Education information will improve Description: Including pain rating scale, medication(s)/side effects and non-pharmacologic comfort measures Outcome: Progressing   Problem: Activity: Goal: Risk for activity intolerance will decrease Outcome: Progressing   Problem: Skin Integrity: Goal: Risk for impaired skin integrity will decrease Outcome: Progressing
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
## 2024-11-11 DIAGNOSIS — S82872A Displaced pilon fracture of left tibia, initial encounter for closed fracture: Secondary | ICD-10-CM | POA: Diagnosis not present

## 2024-11-11 DIAGNOSIS — S82462A Displaced segmental fracture of shaft of left fibula, initial encounter for closed fracture: Secondary | ICD-10-CM | POA: Diagnosis not present

## 2024-11-11 DIAGNOSIS — S93325A Dislocation of tarsometatarsal joint of left foot, initial encounter: Secondary | ICD-10-CM | POA: Diagnosis not present

## 2024-11-11 LAB — CBC
HCT: 26.3 % — ABNORMAL LOW (ref 39.0–52.0)
Hemoglobin: 8.4 g/dL — ABNORMAL LOW (ref 13.0–17.0)
MCH: 29.6 pg (ref 26.0–34.0)
MCHC: 31.9 g/dL (ref 30.0–36.0)
MCV: 92.6 fL (ref 80.0–100.0)
Platelets: 189 K/uL (ref 150–400)
RBC: 2.84 MIL/uL — ABNORMAL LOW (ref 4.22–5.81)
RDW: 15.9 % — ABNORMAL HIGH (ref 11.5–15.5)
WBC: 8.6 K/uL (ref 4.0–10.5)
nRBC: 0.6 % — ABNORMAL HIGH (ref 0.0–0.2)

## 2024-11-11 LAB — BASIC METABOLIC PANEL WITH GFR
Anion gap: 10 (ref 5–15)
BUN: 28 mg/dL — ABNORMAL HIGH (ref 8–23)
CO2: 24 mmol/L (ref 22–32)
Calcium: 8.3 mg/dL — ABNORMAL LOW (ref 8.9–10.3)
Chloride: 103 mmol/L (ref 98–111)
Creatinine, Ser: 1.08 mg/dL (ref 0.61–1.24)
GFR, Estimated: >60 mL/min (ref >60)
Glucose, Bld: 136 mg/dL — ABNORMAL HIGH (ref 70–99)
Potassium: 3.9 mmol/L (ref 3.5–5.1)
Sodium: 137 mmol/L (ref 135–145)

## 2024-11-11 MED ORDER — FUROSEMIDE 10 MG/ML IJ SOLN
40.0000 mg | Freq: Once | INTRAMUSCULAR | Status: AC
  Administered 2024-11-11: 40 mg via INTRAVENOUS
  Filled 2024-11-11: qty 4
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
## 2024-11-11 NOTE — Plan of Care (Signed)
  Problem: Activity: Goal: Risk for activity intolerance will decrease Outcome: Progressing   Problem: Safety: Goal: Ability to remain free from injury will improve Outcome: Progressing   Problem: Skin Integrity: Goal: Skin integrity will improve Outcome: Progressing
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
## 2024-11-11 NOTE — TOC Progression Note (Signed)
 Transition of Care [facility]) - Progression Note    Patient Details  Name: Blake Cruz MRN: [id number] Date of Birth: [birth year]  Transition of Care [facility]) CM/SW Contact  Bridget Cordella Simmonds, LCSW Phone Number: 11/11/2024, 11:14 AM  Clinical Narrative:   Several attempts to contact [name] at [facility]--no answer.      Barriers to Discharge: Continued Medical Work up, SNF Pending bed offer               Expected Discharge Plan and Services In-house Referral: Clinical Social Work     Living arrangements for the past 2 months: Single Family Home                                       Social Drivers of Health (SDOH) Interventions SDOH Screenings   Food Insecurity: Unknown (11/05/2024)  Housing: Low Risk  (11/05/2024)  Transportation Needs: No Transportation Needs (11/05/2024)  Utilities: Not At Risk (11/05/2024)  Financial Resource Strain: Low Risk (10/28/2017)   Received from [facility]  Physical Activity: Inactive (10/28/2017)   Received from [facility]  Social Connections: Moderately Integrated (11/09/2024)  Stress: No Stress Concern Present (10/28/2017)   Received from [facility]  Tobacco Use: Low Risk  (11/05/2024)    Readmission Risk Interventions     No data to display
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
## 2024-11-11 NOTE — Plan of Care (Signed)

## 2024-11-12 DIAGNOSIS — I70201 Unspecified atherosclerosis of native arteries of extremities, right leg: Secondary | ICD-10-CM

## 2024-11-12 DIAGNOSIS — S82872A Displaced pilon fracture of left tibia, initial encounter for closed fracture: Secondary | ICD-10-CM | POA: Diagnosis not present

## 2024-11-12 DIAGNOSIS — S93325A Dislocation of tarsometatarsal joint of left foot, initial encounter: Secondary | ICD-10-CM | POA: Diagnosis not present

## 2024-11-12 DIAGNOSIS — S82462A Displaced segmental fracture of shaft of left fibula, initial encounter for closed fracture: Secondary | ICD-10-CM | POA: Diagnosis not present

## 2024-11-12 MED ORDER — FUROSEMIDE 10 MG/ML IJ SOLN
40.0000 mg | Freq: Once | INTRAMUSCULAR | Status: AC
  Administered 2024-11-12: 40 mg via INTRAVENOUS
  Filled 2024-11-12: qty 4
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
## 2024-11-12 NOTE — TOC Progression Note (Signed)
 Transition of Care [facility]) - Progression Note    Patient Details  Name: Blake Cruz MRN: [id number] Date of Birth: 11/14/1941  [facility] CM/SW Contact  Lendia Dais, [place] Phone Number: 11/12/2024, 9:33 AM  Clinical Narrative:  CSW spoke to [name] of [facility] in [place] and stated they are waiting on the insurance adjuster to negotiate a rate of payment and mentioned that it may not be until next week when they get in contact with them.  CSW will continue to monitor.       Barriers to Discharge: Continued Medical Work up, SNF Pending bed offer               Expected Discharge Plan and Services In-house Referral: Clinical Social Work     Living arrangements for the past 2 months: Single Family Home                                       Social Drivers of Health (SDOH) Interventions SDOH Screenings   Food Insecurity: Unknown (11/05/2024)  Housing: Low Risk  (11/05/2024)  Transportation Needs: No Transportation Needs (11/05/2024)  Utilities: Not At Risk (11/05/2024)  Financial Resource Strain: Low Risk (10/28/2017)   Received from [facility]  Physical Activity: Inactive (10/28/2017)   Received from [facility]  Social Connections: Moderately Integrated (11/09/2024)  Stress: No Stress Concern Present (10/28/2017)   Received from [facility]  Tobacco Use: Low Risk  (11/05/2024)    Readmission Risk Interventions     No data to display

## 2024-11-13 LAB — BASIC METABOLIC PANEL WITH GFR
Anion gap: 11 (ref 5–15)
BUN: 34 mg/dL — ABNORMAL HIGH (ref 8–23)
CO2: 25 mmol/L (ref 22–32)
Calcium: 8.3 mg/dL — ABNORMAL LOW (ref 8.9–10.3)
Chloride: 104 mmol/L (ref 98–111)
Creatinine, Ser: 1.09 mg/dL (ref 0.61–1.24)
GFR, Estimated: >60 mL/min (ref >60)
Glucose, Bld: 139 mg/dL — ABNORMAL HIGH (ref 70–99)
Potassium: 4.3 mmol/L (ref 3.5–5.1)
Sodium: 140 mmol/L (ref 135–145)

## 2024-11-13 MED ORDER — FUROSEMIDE 10 MG/ML IJ SOLN
40.0000 mg | Freq: Once | INTRAMUSCULAR | Status: AC
  Administered 2024-11-13: 40 mg via INTRAVENOUS
  Filled 2024-11-13: qty 4
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
## 2024-11-14 DIAGNOSIS — S82872A Displaced pilon fracture of left tibia, initial encounter for closed fracture: Secondary | ICD-10-CM | POA: Diagnosis not present

## 2024-11-14 DIAGNOSIS — S93325A Dislocation of tarsometatarsal joint of left foot, initial encounter: Secondary | ICD-10-CM | POA: Diagnosis not present

## 2024-11-14 DIAGNOSIS — S82462A Displaced segmental fracture of shaft of left fibula, initial encounter for closed fracture: Secondary | ICD-10-CM | POA: Diagnosis not present

## 2024-11-14 LAB — BASIC METABOLIC PANEL WITH GFR
Anion gap: 5 (ref 5–15)
BUN: 38 mg/dL — ABNORMAL HIGH (ref 8–23)
CO2: 27 mmol/L (ref 22–32)
Calcium: 8.2 mg/dL — ABNORMAL LOW (ref 8.9–10.3)
Chloride: 105 mmol/L (ref 98–111)
Creatinine, Ser: 1.11 mg/dL (ref 0.61–1.24)
GFR, Estimated: >60 mL/min (ref >60)
Glucose, Bld: 166 mg/dL — ABNORMAL HIGH (ref 70–99)
Potassium: 4.6 mmol/L (ref 3.5–5.1)
Sodium: 137 mmol/L (ref 135–145)

## 2024-11-14 MED ORDER — FUROSEMIDE 10 MG/ML IJ SOLN
40.0000 mg | Freq: Once | INTRAMUSCULAR | Status: AC
  Administered 2024-11-14: 40 mg via INTRAVENOUS
  Filled 2024-11-14: qty 4

## 2024-11-14 MED ORDER — MELATONIN 3 MG PO TABS
3.0000 mg | ORAL_TABLET | Freq: Every day | ORAL | Status: DC
  Administered 2024-11-14 – 2024-11-25 (×12): 3 mg via ORAL
  Filled 2024-11-14 (×12): qty 1
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
## 2024-11-14 NOTE — Plan of Care (Signed)
   Problem: Activity: Goal: Risk for activity intolerance will decrease Outcome: Progressing   Problem: Nutrition: Goal: Adequate nutrition will be maintained Outcome: Progressing   Problem: Coping: Goal: Level of anxiety will decrease Outcome: Progressing   Problem: Elimination: Goal: Will not experience complications related to bowel motility Outcome: Progressing Goal: Will not experience complications related to urinary retention Outcome: Progressing

## 2024-11-14 NOTE — Progress Notes (Signed)
 Patient removed sacral foam from sacral area and thru it in the trash. It was CDI.

## 2024-11-14 NOTE — Plan of Care (Signed)
   Problem: Education: Goal: Knowledge of General Education information will improve Description: Including pain rating scale, medication(s)/side effects and non-pharmacologic comfort measures Outcome: Progressing   Problem: Safety: Goal: Ability to remain free from injury will improve Outcome: Progressing   Problem: Skin Integrity: Goal: Risk for impaired skin integrity will decrease Outcome: Progressing

## 2024-11-15 DIAGNOSIS — Z9189 Other specified personal risk factors, not elsewhere classified: Secondary | ICD-10-CM

## 2024-11-15 DIAGNOSIS — S82462A Displaced segmental fracture of shaft of left fibula, initial encounter for closed fracture: Secondary | ICD-10-CM | POA: Diagnosis not present

## 2024-11-15 DIAGNOSIS — S93325A Dislocation of tarsometatarsal joint of left foot, initial encounter: Secondary | ICD-10-CM | POA: Diagnosis not present

## 2024-11-15 DIAGNOSIS — L291 Pruritus scroti: Secondary | ICD-10-CM

## 2024-11-15 DIAGNOSIS — S82872A Displaced pilon fracture of left tibia, initial encounter for closed fracture: Secondary | ICD-10-CM | POA: Diagnosis not present

## 2024-11-15 LAB — BASIC METABOLIC PANEL WITH GFR
Anion gap: 7 (ref 5–15)
BUN: 37 mg/dL — ABNORMAL HIGH (ref 8–23)
CO2: 24 mmol/L (ref 22–32)
Calcium: 8.2 mg/dL — ABNORMAL LOW (ref 8.9–10.3)
Chloride: 105 mmol/L (ref 98–111)
Creatinine, Ser: 1.06 mg/dL (ref 0.61–1.24)
GFR, Estimated: >60 mL/min (ref >60)
Glucose, Bld: 148 mg/dL — ABNORMAL HIGH (ref 70–99)
Potassium: 4.8 mmol/L (ref 3.5–5.1)
Sodium: 136 mmol/L (ref 135–145)

## 2024-11-15 MED ORDER — HYDROXYZINE HCL 25 MG PO TABS: 25.0000 mg | ORAL_TABLET | Freq: Three times a day (TID) | ORAL | Status: DC | PRN

## 2024-11-15 MED ORDER — ACETAMINOPHEN 500 MG PO TABS
1000.0000 mg | ORAL_TABLET | Freq: Three times a day (TID) | ORAL | Status: DC | PRN
  Administered 2024-11-15 – 2024-11-22 (×8): 1000 mg via ORAL
  Filled 2024-11-15 (×8): qty 2

## 2024-11-15 MED ORDER — FUROSEMIDE 10 MG/ML IJ SOLN
40.0000 mg | Freq: Once | INTRAMUSCULAR | Status: AC
  Administered 2024-11-15: 40 mg via INTRAVENOUS
  Filled 2024-11-15: qty 4

## 2024-11-15 MED ORDER — NYSTATIN 100000 UNIT/GM EX POWD
Freq: Two times a day (BID) | CUTANEOUS | Status: DC
  Filled 2024-11-15 (×2): qty 15

## 2024-11-15 NOTE — Plan of Care (Signed)
  Problem: Education: Goal: Knowledge of General Education information will improve Description: Including pain rating scale, medication(s)/side effects and non-pharmacologic comfort measures Outcome: Progressing   Problem: Clinical Measurements: Goal: Ability to maintain clinical measurements within normal limits will improve Outcome: Progressing Goal: Will remain free from infection Outcome: Progressing Goal: Diagnostic test results will improve Outcome: Progressing Goal: Cardiovascular complication will be avoided Outcome: Progressing   Problem: Activity: Goal: Risk for activity intolerance will decrease Outcome: Progressing   Problem: Nutrition: Goal: Adequate nutrition will be maintained Outcome: Progressing   Problem: Coping: Goal: Level of anxiety will decrease Outcome: Progressing   Problem: Elimination: Goal: Will not experience complications related to urinary retention Outcome: Progressing   Problem: Pain Managment: Goal: General experience of comfort will improve and/or be controlled Outcome: Progressing   Problem: Safety: Goal: Ability to remain free from injury will improve Outcome: Progressing   Problem: Clinical Measurements: Goal: Ability to avoid or minimize complications of infection will improve Outcome: Progressing   Problem: Skin Integrity: Goal: Skin integrity will improve Outcome: Progressing
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
## 2024-11-16 DIAGNOSIS — S82872A Displaced pilon fracture of left tibia, initial encounter for closed fracture: Secondary | ICD-10-CM | POA: Diagnosis not present

## 2024-11-16 DIAGNOSIS — S93325A Dislocation of tarsometatarsal joint of left foot, initial encounter: Secondary | ICD-10-CM | POA: Diagnosis not present

## 2024-11-16 DIAGNOSIS — S82462A Displaced segmental fracture of shaft of left fibula, initial encounter for closed fracture: Secondary | ICD-10-CM | POA: Diagnosis not present

## 2024-11-16 LAB — BASIC METABOLIC PANEL WITH GFR
Anion gap: 4 — ABNORMAL LOW (ref 5–15)
BUN: 28 mg/dL — ABNORMAL HIGH (ref 8–23)
CO2: 28 mmol/L (ref 22–32)
Calcium: 8.6 mg/dL — ABNORMAL LOW (ref 8.9–10.3)
Chloride: 105 mmol/L (ref 98–111)
Creatinine, Ser: 0.98 mg/dL (ref 0.61–1.24)
GFR, Estimated: >60 mL/min (ref >60)
Glucose, Bld: 140 mg/dL — ABNORMAL HIGH (ref 70–99)
Potassium: 4.7 mmol/L (ref 3.5–5.1)
Sodium: 137 mmol/L (ref 135–145)
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
## 2024-11-16 NOTE — Plan of Care (Signed)
   Problem: Nutrition: Goal: Adequate nutrition will be maintained Outcome: Progressing   Problem: Coping: Goal: Level of anxiety will decrease Outcome: Progressing   Problem: Elimination: Goal: Will not experience complications related to bowel motility Outcome: Progressing Goal: Will not experience complications related to urinary retention Outcome: Progressing
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
## 2024-11-16 NOTE — Plan of Care (Signed)
  Problem: Safety: Goal: Ability to remain free from injury will improve Outcome: Progressing   Problem: Skin Integrity: Goal: Risk for impaired skin integrity will decrease Outcome: Progressing   Problem: Skin Integrity: Goal: Skin integrity will improve Outcome: Progressing   

## 2024-11-17 DIAGNOSIS — D649 Anemia, unspecified: Secondary | ICD-10-CM | POA: Diagnosis not present

## 2024-11-17 DIAGNOSIS — S93325A Dislocation of tarsometatarsal joint of left foot, initial encounter: Secondary | ICD-10-CM | POA: Diagnosis not present

## 2024-11-17 DIAGNOSIS — S82872A Displaced pilon fracture of left tibia, initial encounter for closed fracture: Secondary | ICD-10-CM | POA: Diagnosis not present

## 2024-11-17 DIAGNOSIS — E871 Hypo-osmolality and hyponatremia: Secondary | ICD-10-CM

## 2024-11-17 DIAGNOSIS — S82462A Displaced segmental fracture of shaft of left fibula, initial encounter for closed fracture: Secondary | ICD-10-CM | POA: Diagnosis not present

## 2024-11-17 LAB — IRON AND TIBC
Iron: 39 ug/dL — ABNORMAL LOW (ref 45–182)
Saturation Ratios: 9 % — ABNORMAL LOW (ref 17.9–39.5)
TIBC: 451 ug/dL — ABNORMAL HIGH (ref 250–450)
UIBC: 412 ug/dL

## 2024-11-17 LAB — BASIC METABOLIC PANEL WITH GFR
Anion gap: 4 — ABNORMAL LOW (ref 5–15)
BUN: 25 mg/dL — ABNORMAL HIGH (ref 8–23)
CO2: 26 mmol/L (ref 22–32)
Calcium: 8.2 mg/dL — ABNORMAL LOW (ref 8.9–10.3)
Chloride: 104 mmol/L (ref 98–111)
Creatinine, Ser: 0.98 mg/dL (ref 0.61–1.24)
GFR, Estimated: >60 mL/min (ref >60)
Glucose, Bld: 148 mg/dL — ABNORMAL HIGH (ref 70–99)
Potassium: 4.2 mmol/L (ref 3.5–5.1)
Sodium: 134 mmol/L — ABNORMAL LOW (ref 135–145)

## 2024-11-17 LAB — TRANSFERRIN: Transferrin: 321 mg/dL (ref 180–329)

## 2024-11-17 LAB — FOLATE: Folate: 6.9 ng/mL (ref >5.9)

## 2024-11-17 LAB — CBC
HCT: 21.9 % — ABNORMAL LOW (ref 39.0–52.0)
HCT: 22.6 % — ABNORMAL LOW (ref 39.0–52.0)
Hemoglobin: 6.7 g/dL — CL (ref 13.0–17.0)
Hemoglobin: 6.9 g/dL — CL (ref 13.0–17.0)
MCH: 29.8 pg (ref 26.0–34.0)
MCH: 29.6 pg (ref 26.0–34.0)
MCHC: 30.6 g/dL (ref 30.0–36.0)
MCHC: 30.5 g/dL (ref 30.0–36.0)
MCV: 97.3 fL (ref 80.0–100.0)
MCV: 97 fL (ref 80.0–100.0)
Platelets: 210 K/uL (ref 150–400)
Platelets: 224 K/uL (ref 150–400)
RBC: 2.25 MIL/uL — ABNORMAL LOW (ref 4.22–5.81)
RBC: 2.33 MIL/uL — ABNORMAL LOW (ref 4.22–5.81)
RDW: 18.4 % — ABNORMAL HIGH (ref 11.5–15.5)
RDW: 18.6 % — ABNORMAL HIGH (ref 11.5–15.5)
WBC: 10.3 K/uL (ref 4.0–10.5)
WBC: 10.4 K/uL (ref 4.0–10.5)
nRBC: 1.5 % — ABNORMAL HIGH (ref 0.0–0.2)
nRBC: 1.1 % — ABNORMAL HIGH (ref 0.0–0.2)

## 2024-11-17 LAB — PREPARE RBC (CROSSMATCH)

## 2024-11-17 LAB — HEMOGLOBIN AND HEMATOCRIT, BLOOD
HCT: 27.5 % — ABNORMAL LOW (ref 39.0–52.0)
Hemoglobin: 8.8 g/dL — ABNORMAL LOW (ref 13.0–17.0)

## 2024-11-17 LAB — FERRITIN: Ferritin: 49 ng/mL (ref 24–336)

## 2024-11-17 LAB — VITAMIN B12: Vitamin B-12: 247 pg/mL (ref 180–914)

## 2024-11-17 LAB — ABO/RH: ABO/RH(D): A POS

## 2024-11-17 MED ORDER — LIDOCAINE HCL URETHRAL/MUCOSAL 2 % EX GEL
1.0000 | Freq: Every day | CUTANEOUS | Status: DC
  Administered 2024-11-18: 1 via URETHRAL
  Filled 2024-11-17 (×3): qty 6

## 2024-11-17 MED ORDER — SENNOSIDES-DOCUSATE SODIUM 8.6-50 MG PO TABS
1.0000 | ORAL_TABLET | Freq: Two times a day (BID) | ORAL | Status: DC
  Administered 2024-11-17 (×2): 1 via ORAL
  Filled 2024-11-17 (×3): qty 1

## 2024-11-17 MED ORDER — BISACODYL 10 MG RE SUPP
10.0000 mg | Freq: Once | RECTAL | Status: AC | PRN
  Administered 2024-11-18: 10 mg via RECTAL
  Filled 2024-11-17: qty 1

## 2024-11-17 MED ORDER — SODIUM CHLORIDE 0.9% IV SOLUTION: Freq: Once | INTRAVENOUS | Status: AC

## 2024-11-17 NOTE — Progress Notes (Signed)
 OT Cancellation Note  Patient Details Name: Blake Cruz MRN: [id number] DOB: [birth year]   Cancelled Treatment:    Reason Eval/Treat Not Completed: Other (comment). Pt just beginning blood transfusion on OT arrival as Hgb dropped to 6.7. OT to follow up as medically appropriate and schedule allows.   Maurilio CROME, OTR/LSABRA  [facility] Acute Rehabilitation  Office: [phone]   Maurilio PARAS Calub Tarnow 11/17/2024, 2:03 PM

## 2024-11-17 NOTE — TOC Progression Note (Signed)
 Transition of Care [facility]) - Progression Note    Patient Details  Name: Blake Cruz MRN: [id number] Date of Birth: 04/07/1941  Transition of [facility] CM/SW Contact  Bridget Cordella Simmonds, LCSW Phone Number: 11/17/2024, 9:35 AM  Clinical Narrative:   Several attempts to call [name]: no answer at the main number.        Barriers to Discharge: Continued Medical Work up, SNF Pending bed offer               Expected Discharge Plan and Services In-house Referral: Clinical Social Work     Living arrangements for the past 2 months: Single Family Home                                       Social Drivers of Health (SDOH) Interventions SDOH Screenings   Food Insecurity: Unknown (11/05/2024)  Housing: Low Risk  (11/05/2024)  Transportation Needs: No Transportation Needs (11/05/2024)  Utilities: Not At Risk (11/05/2024)  Financial Resource Strain: Low Risk (10/28/2017)   Received from [facility]  Physical Activity: Inactive (10/28/2017)   Received from [facility]  Social Connections: Moderately Integrated (11/09/2024)  Stress: No Stress Concern Present (10/28/2017)   Received from [facility]  Tobacco Use: Low Risk  (11/05/2024)    Readmission Risk Interventions     No data to display
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
## 2024-11-18 DIAGNOSIS — S82872A Displaced pilon fracture of left tibia, initial encounter for closed fracture: Secondary | ICD-10-CM | POA: Diagnosis not present

## 2024-11-18 DIAGNOSIS — S93325A Dislocation of tarsometatarsal joint of left foot, initial encounter: Secondary | ICD-10-CM | POA: Diagnosis not present

## 2024-11-18 DIAGNOSIS — D649 Anemia, unspecified: Secondary | ICD-10-CM | POA: Diagnosis not present

## 2024-11-18 DIAGNOSIS — L89159 Pressure ulcer of sacral region, unspecified stage: Secondary | ICD-10-CM

## 2024-11-18 DIAGNOSIS — S82462A Displaced segmental fracture of shaft of left fibula, initial encounter for closed fracture: Secondary | ICD-10-CM | POA: Diagnosis not present

## 2024-11-18 LAB — BPAM RBC
Blood Product Expiration Date: 202512202359
ISSUE DATE / TIME: 202512021041
Unit Type and Rh: 6200

## 2024-11-18 LAB — DIFFERENTIAL
Abs Immature Granulocytes: 1.43 K/uL — ABNORMAL HIGH (ref 0.00–0.07)
Basophils Absolute: 0.2 K/uL — ABNORMAL HIGH (ref 0.0–0.1)
Basophils Relative: 1 %
Eosinophils Absolute: 0.2 K/uL (ref 0.0–0.5)
Eosinophils Relative: 1 %
Immature Granulocytes: 11 %
Lymphocytes Relative: 13 %
Lymphs Abs: 1.7 K/uL (ref 0.7–4.0)
Monocytes Absolute: 1.4 K/uL — ABNORMAL HIGH (ref 0.1–1.0)
Monocytes Relative: 11 %
Neutro Abs: 8.2 K/uL — ABNORMAL HIGH (ref 1.7–7.7)
Neutrophils Relative %: 63 %
Smear Review: NORMAL
nRBC: 1 /100{WBCs} — ABNORMAL HIGH

## 2024-11-18 LAB — TECHNOLOGIST SMEAR REVIEW: Plt Morphology: NORMAL

## 2024-11-18 LAB — BASIC METABOLIC PANEL WITH GFR
Anion gap: 6 (ref 5–15)
BUN: 22 mg/dL (ref 8–23)
CO2: 24 mmol/L (ref 22–32)
Calcium: 8.6 mg/dL — ABNORMAL LOW (ref 8.9–10.3)
Chloride: 107 mmol/L (ref 98–111)
Creatinine, Ser: 0.95 mg/dL (ref 0.61–1.24)
GFR, Estimated: >60 mL/min (ref >60)
Glucose, Bld: 139 mg/dL — ABNORMAL HIGH (ref 70–99)
Potassium: 4.8 mmol/L (ref 3.5–5.1)
Sodium: 137 mmol/L (ref 135–145)

## 2024-11-18 LAB — CBC
HCT: 25.4 % — ABNORMAL LOW (ref 39.0–52.0)
HCT: 28 % — ABNORMAL LOW (ref 39.0–52.0)
Hemoglobin: 7.9 g/dL — ABNORMAL LOW (ref 13.0–17.0)
Hemoglobin: 8.8 g/dL — ABNORMAL LOW (ref 13.0–17.0)
MCH: 29.7 pg (ref 26.0–34.0)
MCH: 29.9 pg (ref 26.0–34.0)
MCHC: 31.1 g/dL (ref 30.0–36.0)
MCHC: 31.4 g/dL (ref 30.0–36.0)
MCV: 95.5 fL (ref 80.0–100.0)
MCV: 95.2 fL (ref 80.0–100.0)
Platelets: 238 K/uL (ref 150–400)
Platelets: 291 K/uL (ref 150–400)
RBC: 2.66 MIL/uL — ABNORMAL LOW (ref 4.22–5.81)
RBC: 2.94 MIL/uL — ABNORMAL LOW (ref 4.22–5.81)
RDW: 19.1 % — ABNORMAL HIGH (ref 11.5–15.5)
RDW: 18.9 % — ABNORMAL HIGH (ref 11.5–15.5)
WBC: 11.2 K/uL — ABNORMAL HIGH (ref 4.0–10.5)
WBC: 13.1 K/uL — ABNORMAL HIGH (ref 4.0–10.5)
nRBC: 1.1 % — ABNORMAL HIGH (ref 0.0–0.2)
nRBC: 0.8 % — ABNORMAL HIGH (ref 0.0–0.2)

## 2024-11-18 LAB — TYPE AND SCREEN
ABO/RH(D): A POS
Antibody Screen: NEGATIVE
Unit division: 0

## 2024-11-18 LAB — RETICULOCYTES
Immature Retic Fract: 37.8 % — ABNORMAL HIGH (ref 2.3–15.9)
RBC.: 2.66 MIL/uL — ABNORMAL LOW (ref 4.22–5.81)
Retic Count, Absolute: 172.9 K/uL (ref 19.0–186.0)
Retic Ct Pct: 6.5 % — ABNORMAL HIGH (ref 0.4–3.1)

## 2024-11-18 LAB — BILIRUBIN, FRACTIONATED(TOT/DIR/INDIR)
Bilirubin, Direct: 0.1 mg/dL (ref 0.0–0.2)
Indirect Bilirubin: 0.7 mg/dL (ref 0.3–0.9)
Total Bilirubin: 0.8 mg/dL (ref 0.0–1.2)

## 2024-11-18 LAB — LACTATE DEHYDROGENASE: LDH: 246 U/L — ABNORMAL HIGH (ref 105–235)

## 2024-11-18 MED ORDER — CYANOCOBALAMIN 1000 MCG/ML IJ SOLN
1000.0000 ug | INTRAMUSCULAR | Status: DC
  Administered 2024-11-18: 1000 ug via INTRAMUSCULAR
  Filled 2024-11-18 (×2): qty 1

## 2024-11-18 MED ORDER — PANTOPRAZOLE SODIUM 40 MG PO TBEC
40.0000 mg | DELAYED_RELEASE_TABLET | Freq: Two times a day (BID) | ORAL | Status: DC
  Administered 2024-11-18 – 2024-11-22 (×9): 40 mg via ORAL
  Filled 2024-11-18 (×9): qty 1

## 2024-11-18 MED ORDER — SODIUM CHLORIDE 0.9 % IV SOLN
500.0000 mg | Freq: Once | INTRAVENOUS | Status: AC
  Administered 2024-11-18: 500 mg via INTRAVENOUS
  Filled 2024-11-18: qty 25

## 2024-11-18 MED ORDER — IRON SUCROSE 500 MG IVPB - SIMPLE MED
500.0000 mg | Freq: Once | INTRAVENOUS | Status: DC
  Filled 2024-11-18: qty 275

## 2024-11-18 MED ORDER — PANTOPRAZOLE SODIUM 40 MG PO TBEC: 40.0000 mg | DELAYED_RELEASE_TABLET | Freq: Every day | ORAL | Status: DC
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
## 2024-11-18 NOTE — TOC Progression Note (Addendum)
 Transition of Care [facility]) - Progression Note    Patient Details  Name: Blake Cruz MRN: [id number] Date of Birth: 06/28/1941  [facility] CM/SW Contact  Bridget Cordella Simmonds, LCSW Phone Number: 11/18/2024, 1:31 PM  Clinical Narrative:    Several more attempts to reach [name]/Autumn care.  Message left requesting update.   CSW spoke with pt, difficult to communicate as he is hard of hearing.  Pt asking CSW to speak with his daughter.  CSW spoke with Joann, who is concerned, was told pt bleeding, was told pt will have surgery next Tuesday.   1400: TC [name].  She has been unable to reach claims adjustor, has left messages.  Adjustor is Lonell Sprang, [phone].  CSW also left message with Lonell Sprang.  1530: TC Dana Brooks/adjustor: they do not pay for post acute care.  Typically, there is not any money left after hospital bills are paid.     Barriers to Discharge: Continued Medical Work up, SNF Pending bed offer               Expected Discharge Plan and Services In-house Referral: Clinical Social Work     Living arrangements for the past 2 months: Single Family Home                                       Social Drivers of Health (SDOH) Interventions SDOH Screenings   Food Insecurity: Unknown (11/05/2024)  Housing: Low Risk  (11/05/2024)  Transportation Needs: No Transportation Needs (11/05/2024)  Utilities: Not At Risk (11/05/2024)  Financial Resource Strain: Low Risk (10/28/2017)   Received from [facility]  Physical Activity: Inactive (10/28/2017)   Received from [facility]  Social Connections: Moderately Integrated (11/09/2024)  Stress: No Stress Concern Present (10/28/2017)   Received from [facility]  Tobacco Use: Low Risk  (11/05/2024)    Readmission Risk Interventions     No data to display
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
## 2024-11-19 DIAGNOSIS — S93325A Dislocation of tarsometatarsal joint of left foot, initial encounter: Secondary | ICD-10-CM | POA: Diagnosis not present

## 2024-11-19 DIAGNOSIS — S82462A Displaced segmental fracture of shaft of left fibula, initial encounter for closed fracture: Secondary | ICD-10-CM | POA: Diagnosis not present

## 2024-11-19 DIAGNOSIS — S82872A Displaced pilon fracture of left tibia, initial encounter for closed fracture: Secondary | ICD-10-CM | POA: Diagnosis not present

## 2024-11-19 DIAGNOSIS — D649 Anemia, unspecified: Secondary | ICD-10-CM | POA: Diagnosis not present

## 2024-11-19 LAB — CBC
HCT: 26.8 % — ABNORMAL LOW (ref 39.0–52.0)
Hemoglobin: 8.2 g/dL — ABNORMAL LOW (ref 13.0–17.0)
MCH: 29.3 pg (ref 26.0–34.0)
MCHC: 30.6 g/dL (ref 30.0–36.0)
MCV: 95.7 fL (ref 80.0–100.0)
Platelets: 262 K/uL (ref 150–400)
RBC: 2.8 MIL/uL — ABNORMAL LOW (ref 4.22–5.81)
RDW: 18.7 % — ABNORMAL HIGH (ref 11.5–15.5)
WBC: 10.7 K/uL — ABNORMAL HIGH (ref 4.0–10.5)
nRBC: 1 % — ABNORMAL HIGH (ref 0.0–0.2)

## 2024-11-19 LAB — BASIC METABOLIC PANEL WITH GFR
Anion gap: 3 — ABNORMAL LOW (ref 5–15)
BUN: 19 mg/dL (ref 8–23)
CO2: 27 mmol/L (ref 22–32)
Calcium: 8.6 mg/dL — ABNORMAL LOW (ref 8.9–10.3)
Chloride: 105 mmol/L (ref 98–111)
Creatinine, Ser: 1.13 mg/dL (ref 0.61–1.24)
GFR, Estimated: >60 mL/min (ref >60)
Glucose, Bld: 123 mg/dL — ABNORMAL HIGH (ref 70–99)
Potassium: 4.8 mmol/L (ref 3.5–5.1)
Sodium: 135 mmol/L (ref 135–145)

## 2024-11-19 LAB — HAPTOGLOBIN: Haptoglobin: 53 mg/dL (ref 38–329)

## 2024-11-19 MED ORDER — ENOXAPARIN SODIUM 40 MG/0.4ML IJ SOSY
40.0000 mg | PREFILLED_SYRINGE | INTRAMUSCULAR | Status: DC
  Administered 2024-11-19 – 2024-11-20 (×2): 40 mg via SUBCUTANEOUS
  Filled 2024-11-19 (×2): qty 0.4

## 2024-11-19 MED ORDER — LIDOCAINE HCL URETHRAL/MUCOSAL 2 % EX GEL
1.0000 | Freq: Every day | CUTANEOUS | Status: DC | PRN
  Administered 2024-11-19: 1 via URETHRAL
  Filled 2024-11-19: qty 6
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
## 2024-11-19 NOTE — Plan of Care (Signed)
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
## 2024-11-20 LAB — CBC
HCT: 26.1 % — ABNORMAL LOW (ref 39.0–52.0)
Hemoglobin: 8.2 g/dL — ABNORMAL LOW (ref 13.0–17.0)
MCH: 29.9 pg (ref 26.0–34.0)
MCHC: 31.4 g/dL (ref 30.0–36.0)
MCV: 95.3 fL (ref 80.0–100.0)
Platelets: 263 K/uL (ref 150–400)
RBC: 2.74 MIL/uL — ABNORMAL LOW (ref 4.22–5.81)
RDW: 18.9 % — ABNORMAL HIGH (ref 11.5–15.5)
WBC: 8.1 K/uL (ref 4.0–10.5)
nRBC: 0.7 % — ABNORMAL HIGH (ref 0.0–0.2)

## 2024-11-20 MED ORDER — FUROSEMIDE 10 MG/ML IJ SOLN
40.0000 mg | Freq: Once | INTRAMUSCULAR | Status: AC
  Administered 2024-11-20: 40 mg via INTRAVENOUS
  Filled 2024-11-20: qty 4

## 2024-11-20 MED ORDER — ENOXAPARIN SODIUM 80 MG/0.8ML IJ SOSY
65.0000 mg | PREFILLED_SYRINGE | INTRAMUSCULAR | Status: DC
  Administered 2024-11-21 – 2024-11-23 (×3): 65 mg via SUBCUTANEOUS
  Filled 2024-11-20 (×3): qty 0.65
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
## 2024-11-20 NOTE — Plan of Care (Signed)
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
## 2024-11-20 NOTE — Plan of Care (Signed)
  Problem: Activity: Goal: Risk for activity intolerance will decrease Outcome: Progressing   Problem: Nutrition: Goal: Adequate nutrition will be maintained Outcome: Progressing   Problem: Elimination: Goal: Will not experience complications related to bowel motility Outcome: Progressing Goal: Will not experience complications related to urinary retention Outcome: Progressing   Problem: Safety: Goal: Ability to remain free from injury will improve Outcome: Progressing   Problem: Skin Integrity: Goal: Skin integrity will improve Outcome: Progressing

## 2024-11-21 NOTE — Progress Notes (Signed)
 Patient is advising of a 'burning' pain to the top of his left foot.  It is too soon to give PRN Tylenol  or Robaxin .  Dr. Harrie is reviewing at this time.

## 2024-11-21 NOTE — Plan of Care (Signed)

## 2024-11-21 NOTE — Plan of Care (Signed)
  Problem: Education: Goal: Knowledge of General Education information will improve Description: Including pain rating scale, medication(s)/side effects and non-pharmacologic comfort measures Outcome: Progressing   Problem: Clinical Measurements: Goal: Will remain free from infection Outcome: Progressing Goal: Diagnostic test results will improve Outcome: Progressing Goal: Respiratory complications will improve Outcome: Progressing Goal: Cardiovascular complication will be avoided Outcome: Progressing   Problem: Activity: Goal: Risk for activity intolerance will decrease Outcome: Progressing   Problem: Coping: Goal: Level of anxiety will decrease Outcome: Progressing   Problem: Elimination: Goal: Will not experience complications related to bowel motility Outcome: Progressing Goal: Will not experience complications related to urinary retention Outcome: Progressing   Problem: Safety: Goal: Ability to remain free from injury will improve Outcome: Progressing   Problem: Skin Integrity: Goal: Risk for impaired skin integrity will decrease Outcome: Progressing   Problem: Skin Integrity: Goal: Skin integrity will improve Outcome: Progressing
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
## 2024-11-22 MED ORDER — PANTOPRAZOLE SODIUM 40 MG PO TBEC
40.0000 mg | DELAYED_RELEASE_TABLET | Freq: Every day | ORAL | Status: DC
  Administered 2024-11-23 – 2024-11-26 (×4): 40 mg via ORAL
  Filled 2024-11-22 (×4): qty 1
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
## 2024-11-22 NOTE — Plan of Care (Signed)

## 2024-11-23 LAB — CBC
HCT: 28.7 % — ABNORMAL LOW (ref 39.0–52.0)
Hemoglobin: 8.9 g/dL — ABNORMAL LOW (ref 13.0–17.0)
MCH: 30.4 pg (ref 26.0–34.0)
MCHC: 31 g/dL (ref 30.0–36.0)
MCV: 98 fL (ref 80.0–100.0)
Platelets: 232 K/uL (ref 150–400)
RBC: 2.93 MIL/uL — ABNORMAL LOW (ref 4.22–5.81)
RDW: 19 % — ABNORMAL HIGH (ref 11.5–15.5)
WBC: 5.2 K/uL (ref 4.0–10.5)
nRBC: 0 % (ref 0.0–0.2)

## 2024-11-23 LAB — BASIC METABOLIC PANEL WITH GFR
Anion gap: 9 (ref 5–15)
BUN: 18 mg/dL (ref 8–23)
CO2: 24 mmol/L (ref 22–32)
Calcium: 8.9 mg/dL (ref 8.9–10.3)
Chloride: 106 mmol/L (ref 98–111)
Creatinine, Ser: 1.11 mg/dL (ref 0.61–1.24)
GFR, Estimated: >60 mL/min (ref >60)
Glucose, Bld: 114 mg/dL — ABNORMAL HIGH (ref 70–99)
Potassium: 4.8 mmol/L (ref 3.5–5.1)
Sodium: 139 mmol/L (ref 135–145)

## 2024-11-23 MED ORDER — CEFAZOLIN SODIUM-DEXTROSE 2-4 GM/100ML-% IV SOLN
2.0000 g | INTRAVENOUS | Status: AC
  Administered 2024-11-24: 1 g via INTRAVENOUS
  Administered 2024-11-24: 2 g via INTRAVENOUS
  Filled 2024-11-23 (×2): qty 100

## 2024-11-23 MED ORDER — FUROSEMIDE 10 MG/ML IJ SOLN
40.0000 mg | Freq: Once | INTRAMUSCULAR | Status: AC
  Administered 2024-11-23: 40 mg via INTRAVENOUS
  Filled 2024-11-23: qty 4

## 2024-11-23 NOTE — Plan of Care (Signed)
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
## 2024-11-24 ENCOUNTER — Encounter (HOSPITAL_COMMUNITY): Admission: EM | Disposition: A | Discharge status: Skilled Nursing Facility | Payer: Self-pay | Source: Home / Self Care

## 2024-11-24 ENCOUNTER — Inpatient Hospital Stay (HOSPITAL_COMMUNITY): Admitting: Certified Registered Nurse Anesthetist

## 2024-11-24 ENCOUNTER — Encounter (HOSPITAL_COMMUNITY): Payer: Self-pay

## 2024-11-24 ENCOUNTER — Inpatient Hospital Stay (HOSPITAL_COMMUNITY)

## 2024-11-24 DIAGNOSIS — S82872A Displaced pilon fracture of left tibia, initial encounter for closed fracture: Secondary | ICD-10-CM

## 2024-11-24 HISTORY — PX: EXTERNAL FIXATION REMOVAL: SHX5040

## 2024-11-24 HISTORY — PX: OPEN REDUCTION INTERNAL FIXATION (ORIF) TIBIA/FIBULA FRACTURE: SHX5992

## 2024-11-24 LAB — BASIC METABOLIC PANEL WITH GFR
Anion gap: 14 (ref 5–15)
BUN: 18 mg/dL (ref 8–23)
CO2: 21 mmol/L — ABNORMAL LOW (ref 22–32)
Calcium: 8.6 mg/dL — ABNORMAL LOW (ref 8.9–10.3)
Chloride: 102 mmol/L (ref 98–111)
Creatinine, Ser: 1.25 mg/dL — ABNORMAL HIGH (ref 0.61–1.24)
GFR, Estimated: 57 mL/min — ABNORMAL LOW (ref >60)
Glucose, Bld: 105 mg/dL — ABNORMAL HIGH (ref 70–99)
Potassium: 4.4 mmol/L (ref 3.5–5.1)
Sodium: 137 mmol/L (ref 135–145)

## 2024-11-24 LAB — CBC
HCT: 29.9 % — ABNORMAL LOW (ref 39.0–52.0)
Hemoglobin: 9.1 g/dL — ABNORMAL LOW (ref 13.0–17.0)
MCH: 29.7 pg (ref 26.0–34.0)
MCHC: 30.4 g/dL (ref 30.0–36.0)
MCV: 97.7 fL (ref 80.0–100.0)
Platelets: 214 K/uL (ref 150–400)
RBC: 3.06 MIL/uL — ABNORMAL LOW (ref 4.22–5.81)
RDW: 18.9 % — ABNORMAL HIGH (ref 11.5–15.5)
WBC: 4.5 K/uL (ref 4.0–10.5)
nRBC: 0 % (ref 0.0–0.2)

## 2024-11-24 LAB — MAGNESIUM: Magnesium: 2 mg/dL (ref 1.7–2.4)

## 2024-11-24 SURGERY — OPEN REDUCTION INTERNAL FIXATION (ORIF) TIBIA/FIBULA FRACTURE
Anesthesia: General | Site: Leg Lower | Laterality: Left

## 2024-11-24 MED ORDER — 0.9 % SODIUM CHLORIDE (POUR BTL) OPTIME
TOPICAL | Status: DC | PRN
  Administered 2024-11-24: 1000 mL

## 2024-11-24 MED ORDER — CHLORHEXIDINE GLUCONATE 0.12 % MT SOLN
15.0000 mL | Freq: Once | OROMUCOSAL | Status: AC
  Administered 2024-11-24: 15 mL via OROMUCOSAL

## 2024-11-24 MED ORDER — LIDOCAINE HCL (CARDIAC) PF 100 MG/5ML IV SOSY
PREFILLED_SYRINGE | INTRAVENOUS | Status: DC | PRN
  Administered 2024-11-24: 100 mg via INTRAVENOUS

## 2024-11-24 MED ORDER — OXYCODONE HCL 5 MG PO TABS
5.0000 mg | ORAL_TABLET | Freq: Once | ORAL | Status: AC | PRN
  Administered 2024-11-24: 5 mg via ORAL

## 2024-11-24 MED ORDER — PROPOFOL 500 MG/50ML IV EMUL
INTRAVENOUS | Status: DC | PRN
  Administered 2024-11-24: 20 ug/kg/min via INTRAVENOUS

## 2024-11-24 MED ORDER — DEXAMETHASONE SOD PHOSPHATE PF 10 MG/ML IJ SOLN
INTRAMUSCULAR | Status: DC | PRN
  Administered 2024-11-24: 5 mg via INTRAVENOUS

## 2024-11-24 MED ORDER — PHENYLEPHRINE 80 MCG/ML (10ML) SYRINGE FOR IV PUSH (FOR BLOOD PRESSURE SUPPORT)
PREFILLED_SYRINGE | INTRAVENOUS | Status: DC | PRN
  Administered 2024-11-24: 120 ug via INTRAVENOUS

## 2024-11-24 MED ORDER — DROPERIDOL 2.5 MG/ML IJ SOLN: 0.6250 mg | Freq: Once | INTRAMUSCULAR | Status: DC | PRN

## 2024-11-24 MED ORDER — FENTANYL CITRATE (PF) 100 MCG/2ML IJ SOLN
INTRAMUSCULAR | Status: AC
  Filled 2024-11-24: qty 2

## 2024-11-24 MED ORDER — OXYCODONE HCL 5 MG PO TABS
5.0000 mg | ORAL_TABLET | ORAL | Status: DC | PRN
  Administered 2024-11-24 – 2024-11-26 (×10): 5 mg via ORAL
  Filled 2024-11-24 (×10): qty 1

## 2024-11-24 MED ORDER — EPHEDRINE SULFATE-NACL 50-0.9 MG/10ML-% IV SOSY
PREFILLED_SYRINGE | INTRAVENOUS | Status: DC | PRN
  Administered 2024-11-24: 10 mg via INTRAVENOUS

## 2024-11-24 MED ORDER — PROPOFOL 10 MG/ML IV BOLUS
INTRAVENOUS | Status: AC
  Filled 2024-11-24: qty 20

## 2024-11-24 MED ORDER — FENTANYL CITRATE (PF) 250 MCG/5ML IJ SOLN
INTRAMUSCULAR | Status: AC
  Filled 2024-11-24: qty 5

## 2024-11-24 MED ORDER — LACTATED RINGERS IV SOLN: INTRAVENOUS | Status: DC

## 2024-11-24 MED ORDER — ONDANSETRON HCL 4 MG/2ML IJ SOLN
INTRAMUSCULAR | Status: DC | PRN
  Administered 2024-11-24: 4 mg via INTRAVENOUS

## 2024-11-24 MED ORDER — ACETAMINOPHEN 500 MG PO TABS
1000.0000 mg | ORAL_TABLET | Freq: Once | ORAL | Status: AC
  Administered 2024-11-24: 1000 mg via ORAL
  Filled 2024-11-24: qty 2

## 2024-11-24 MED ORDER — ENOXAPARIN SODIUM 80 MG/0.8ML IJ SOSY
65.0000 mg | PREFILLED_SYRINGE | INTRAMUSCULAR | Status: DC
  Administered 2024-11-24: 65 mg via SUBCUTANEOUS
  Filled 2024-11-24 (×2): qty 0.65

## 2024-11-24 MED ORDER — ORAL CARE MOUTH RINSE: 15.0000 mL | Freq: Once | OROMUCOSAL | Status: AC

## 2024-11-24 MED ORDER — CEFAZOLIN SODIUM-DEXTROSE 2-4 GM/100ML-% IV SOLN
2.0000 g | Freq: Three times a day (TID) | INTRAVENOUS | Status: AC
  Administered 2024-11-24 – 2024-11-25 (×3): 2 g via INTRAVENOUS
  Filled 2024-11-24 (×3): qty 100

## 2024-11-24 MED ORDER — HYDROMORPHONE HCL 1 MG/ML IJ SOLN
0.5000 mg | INTRAMUSCULAR | Status: DC | PRN
  Administered 2024-11-24 – 2024-11-25 (×2): 0.5 mg via INTRAVENOUS
  Filled 2024-11-24 (×2): qty 0.5

## 2024-11-24 MED ORDER — FENTANYL CITRATE (PF) 100 MCG/2ML IJ SOLN
25.0000 ug | INTRAMUSCULAR | Status: DC | PRN
  Administered 2024-11-24: 25 ug via INTRAVENOUS

## 2024-11-24 MED ORDER — PHENYLEPHRINE HCL-NACL 20-0.9 MG/250ML-% IV SOLN
INTRAVENOUS | Status: DC | PRN
  Administered 2024-11-24: 35 ug/min via INTRAVENOUS

## 2024-11-24 MED ORDER — OXYCODONE HCL 5 MG/5ML PO SOLN: 5.0000 mg | Freq: Once | ORAL | Status: AC | PRN

## 2024-11-24 MED ORDER — PROPOFOL 10 MG/ML IV BOLUS
INTRAVENOUS | Status: DC | PRN
  Administered 2024-11-24: 170 mg via INTRAVENOUS

## 2024-11-24 MED ORDER — OXYCODONE HCL 5 MG PO TABS
ORAL_TABLET | ORAL | Status: AC
  Filled 2024-11-24: qty 1

## 2024-11-24 MED ORDER — FENTANYL CITRATE (PF) 250 MCG/5ML IJ SOLN
INTRAMUSCULAR | Status: DC | PRN
  Administered 2024-11-24 (×2): 50 ug via INTRAVENOUS
  Administered 2024-11-24: 100 ug via INTRAVENOUS
  Administered 2024-11-24: 50 ug via INTRAVENOUS

## 2024-11-24 SURGICAL SUPPLY — 58 items
BAG COUNTER SPONGE SURGICOUNT (BAG) ×1 IMPLANT
BIT DRILL 2.5X2.75 QC CALB (BIT) IMPLANT
BNDG ELASTIC 4INX 5YD STR LF (GAUZE/BANDAGES/DRESSINGS) IMPLANT
BNDG ELASTIC 4X5.8 VLCR STR LF (GAUZE/BANDAGES/DRESSINGS) ×1 IMPLANT
BNDG ELASTIC 6INX 5YD STR LF (GAUZE/BANDAGES/DRESSINGS) ×1 IMPLANT
BNDG GAUZE DERMACEA FLUFF 4 (GAUZE/BANDAGES/DRESSINGS) ×2 IMPLANT
BRUSH SCRUB EZ 4% CHG (MISCELLANEOUS) ×1 IMPLANT
BRUSH SCRUB EZ PLAIN DRY (MISCELLANEOUS) ×2 IMPLANT
COVER SURGICAL LIGHT HANDLE (MISCELLANEOUS) ×2 IMPLANT
DRAPE C-ARM 42X72 X-RAY (DRAPES) IMPLANT
DRAPE C-ARMOR (DRAPES) ×1 IMPLANT
DRAPE U-SHAPE 47X51 STRL (DRAPES) ×1 IMPLANT
DRSG ADAPTIC 3X8 NADH LF (GAUZE/BANDAGES/DRESSINGS) ×1 IMPLANT
DRSG MEPITEL 4X7.2 (GAUZE/BANDAGES/DRESSINGS) IMPLANT
ELECTRODE REM PT RTRN 9FT ADLT (ELECTROSURGICAL) ×1 IMPLANT
GAUZE PAD ABD 8X10 STRL (GAUZE/BANDAGES/DRESSINGS) IMPLANT
GAUZE SPONGE 4X4 12PLY STRL (GAUZE/BANDAGES/DRESSINGS) ×1 IMPLANT
GLOVE BIO SURGEON STRL SZ8 (GLOVE) ×2 IMPLANT
GLOVE BIOGEL PI IND STRL 7.5 (GLOVE) ×1 IMPLANT
GLOVE BIOGEL PI IND STRL 8 (GLOVE) ×1 IMPLANT
GLOVE SURG ORTHO LTX SZ7.5 (GLOVE) ×2 IMPLANT
GOWN STRL REUS W/ TWL LRG LVL3 (GOWN DISPOSABLE) ×2 IMPLANT
GOWN STRL REUS W/ TWL XL LVL3 (GOWN DISPOSABLE) ×1 IMPLANT
KIT BASIN OR (CUSTOM PROCEDURE TRAY) ×1 IMPLANT
KIT TURNOVER KIT B (KITS) ×1 IMPLANT
KWIRE ALPS MXV 1.6X6 ZI (WIRE) IMPLANT
MANIFOLD NEPTUNE II (INSTRUMENTS) ×1 IMPLANT
PACK ORTHO EXTREMITY (CUSTOM PROCEDURE TRAY) ×1 IMPLANT
PAD ARMBOARD POSITIONER FOAM (MISCELLANEOUS) ×2 IMPLANT
PAD CAST 4YDX4 CTTN HI CHSV (CAST SUPPLIES) IMPLANT
PADDING CAST COTTON 6X4 STRL (CAST SUPPLIES) ×3 IMPLANT
PLATE LAT FIB 8H LT (Plate) IMPLANT
PLATE TIB MED 12H LT (Plate) IMPLANT
SCREW LOCK MDS 3.5X18 (Screw) IMPLANT
SCREW LOCK MDS 3.5X20 (Screw) IMPLANT
SCREW LOCK MDS 3.5X22 (Screw) IMPLANT
SCREW LOCK MDS 3.5X34 (Screw) IMPLANT
SCREW LOCK MDS 3.5X50 (Screw) IMPLANT
SCREW NL3.5X50 (Screw) IMPLANT
SCREW NLOCK 3.5X20 (Screw) IMPLANT
SCREW NLOCK 3.5X24 (Screw) IMPLANT
SCREW NLOCK MDS 3.5X46 (Screw) IMPLANT
SCREW NLOCK MDS 3.5X48 (Screw) IMPLANT
SCREW NON-LOCK 3.5X10 (Screw) IMPLANT
SCREW NON-LOCK 3.5X12 (Screw) IMPLANT
SCREW NON-LOCK 3.5X16 (Screw) IMPLANT
SOLN 0.9% NACL POUR BTL 1000ML (IV SOLUTION) ×1 IMPLANT
SOLN STERILE WATER BTL 1000 ML (IV SOLUTION) ×2 IMPLANT
SPLINT PLASTER CAST FAST 5X30 (CAST SUPPLIES) IMPLANT
SPONGE T-LAP 18X18 ~~LOC~~+RFID (SPONGE) ×1 IMPLANT
STAPLER SKIN PROX 35W (STAPLE) IMPLANT
SUT ETHILON 2 0 FS 18 (SUTURE) IMPLANT
SUT VIC AB 0 CT1 36 (SUTURE) IMPLANT
SUT VIC AB 1 CT1 27XBRD ANBCTR (SUTURE) IMPLANT
SUT VIC AB 2-0 CT1 TAPERPNT 27 (SUTURE) IMPLANT
TOWEL GREEN STERILE (TOWEL DISPOSABLE) ×2 IMPLANT
TOWEL GREEN STERILE FF (TOWEL DISPOSABLE) ×2 IMPLANT
UNDERPAD 30X36 HEAVY ABSORB (UNDERPADS AND DIAPERS) ×1 IMPLANT

## 2024-11-24 NOTE — Anesthesia Preprocedure Evaluation (Addendum)
 Anesthesia Evaluation  Patient identified by MRN, date of birth, ID band Patient awake    Reviewed: Allergy & Precautions, NPO status , Patient's Chart, lab work & pertinent test results  History of Anesthesia Complications (+) PONV and history of anesthetic complications  Airway Mallampati: III  TM Distance: >3 FB Neck ROM: Full    Dental  (+) Edentulous Lower, Edentulous Upper   Pulmonary neg pulmonary ROS   Pulmonary exam normal        Cardiovascular hypertension, Pt. on medications Normal cardiovascular exam     Neuro/Psych negative neurological ROS     GI/Hepatic Neg liver ROS,GERD  Medicated,,  Endo/Other  negative endocrine ROS    Renal/GU Cr 1.25     Musculoskeletal left pilon fracture, retained external fixator   Abdominal   Peds  Hematology  (+) Blood dyscrasia (Hgb 9.1), anemia   Anesthesia Other Findings   Reproductive/Obstetrics                              Anesthesia Physical Anesthesia Plan  ASA: 2  Anesthesia Plan: General   Post-op Pain Management: Tylenol  PO (pre-op)*   Induction: Intravenous  PONV Risk Score and Plan: 3 and Treatment may vary due to age or medical condition, Ondansetron , Dexamethasone , Midazolam , Propofol  infusion and TIVA  Airway Management Planned: LMA  Additional Equipment: None  Intra-op Plan:   Post-operative Plan: Extubation in OR  Informed Consent: I have reviewed the patients History and Physical, chart, labs and discussed the procedure including the risks, benefits and alternatives for the proposed anesthesia with the patient or authorized representative who has indicated his/her understanding and acceptance.     Dental advisory given  Plan Discussed with: CRNA  Anesthesia Plan Comments:          Anesthesia Quick Evaluation

## 2024-11-24 NOTE — Anesthesia Procedure Notes (Signed)
 Procedure Name: LMA Insertion Date/Time: 11/24/2024 12:11 PM  Performed by: Cindie Donald CROME, CRNAPre-anesthesia Checklist: Patient identified, Emergency Drugs available, Suction available and Patient being monitored Patient Re-evaluated:Patient Re-evaluated prior to induction Oxygen Delivery Method: Circle System Utilized Preoxygenation: Pre-oxygenation with 100% oxygen Induction Type: IV induction Ventilation: Mask ventilation without difficulty LMA: LMA inserted LMA Size: 4.0 Number of attempts: 1 Placement Confirmation: positive ETCO2 Tube secured with: Tape Dental Injury: Teeth and Oropharynx as per pre-operative assessment

## 2024-11-24 NOTE — Progress Notes (Signed)
 OT Cancellation Note  Patient Details Name: Blake Cruz MRN: [id number] DOB: Feb 13, 1941   Cancelled Treatment:    Reason Eval/Treat Not Completed: Other (comment) (surgery planned for today. Will follow up postop.)  Masahiro Iglesia,HILLARY 11/24/2024, 6:02 AM Kreg Sink, OT/L   Acute OT Clinical Specialist Acute Rehabilitation Services Pager [phone] Office [phone]

## 2024-11-24 NOTE — Plan of Care (Signed)
  Problem: Activity: Goal: Risk for activity intolerance will decrease Outcome: Progressing   Problem: Pain Managment: Goal: General experience of comfort will improve and/or be controlled Outcome: Progressing   Problem: Safety: Goal: Ability to remain free from injury will improve Outcome: Progressing   Problem: Skin Integrity: Goal: Risk for impaired skin integrity will decrease Outcome: Progressing

## 2024-11-24 NOTE — Transfer of Care (Signed)
 Immediate Anesthesia Transfer of Care Note  Patient: Blake Cruz  Procedure(s) Performed: OPEN REDUCTION INTERNAL FIXATION (ORIF) DISTAL TIBIA FIBULA FRACTURE (Left: Leg Lower) REMOVAL, EXTERNAL FIXATION DEVICE, LOWER EXTREMITY (Left: Leg Lower)  Patient Location: PACU  Anesthesia Type:General  Level of Consciousness: drowsy and patient cooperative  Airway & Oxygen Therapy: Patient Spontanous Breathing and Patient connected to face mask oxygen  Post-op Assessment: Report given to RN and Post -op Vital signs reviewed and stable  Post vital signs: Reviewed and stable  Last Vitals:  Vitals Value Taken Time  BP 127/77 11/24/24 15:05  Temp    Pulse 87 11/24/24 15:08  Resp 8 11/24/24 15:08  SpO2 92 % 11/24/24 15:08  Vitals shown include unfiled device data.  Last Pain:  Vitals:   11/24/24 1114  TempSrc: Oral  PainSc:       Patients Stated Pain Goal: 0 (11/23/24 2124)  Complications: No notable events documented.

## 2024-11-24 NOTE — Anesthesia Postprocedure Evaluation (Signed)
 Anesthesia Post Note  Patient: Engineer, Maintenance (it)  Procedure(s) Performed: OPEN REDUCTION INTERNAL FIXATION (ORIF) DISTAL TIBIA FIBULA FRACTURE (Left: Leg Lower) REMOVAL, EXTERNAL FIXATION DEVICE, LOWER EXTREMITY (Left: Leg Lower)     Patient location during evaluation: PACU Anesthesia Type: General Level of consciousness: awake and alert Pain management: pain level controlled Vital Signs Assessment: post-procedure vital signs reviewed and stable Respiratory status: spontaneous breathing, nonlabored ventilation and respiratory function stable Cardiovascular status: blood pressure returned to baseline Postop Assessment: no apparent nausea or vomiting Anesthetic complications: no   No notable events documented.  Last Vitals:  Vitals:   11/24/24 1620 11/24/24 1620  BP: 128/80   Pulse: 84 80  Resp: 18   Temp: 36.7 C   SpO2: (!) 88% 95%    Last Pain:  Vitals:   11/24/24 1620  TempSrc: Oral  PainSc:                  Vertell Row
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
## 2024-11-24 NOTE — Op Note (Signed)
 11/24/2024  4:00 PM  PATIENT:  Blake Cruz  Feb 14, 1941 male   MEDICAL RECORD NUMBER: [id number]  PRE-OPERATIVE DIAGNOSIS:   1. LEFT ANKLE PILON FRACTURE, TIBIA AND FIBULA 2. RETAINED EXTERNALFIXATOR LEFT ANKLE AND FOOT 3. ULCERATED PIN SITES LEG AND HEEL 4. LEFT FOOT MULTIPLE METATARSAL BASE FRACTURES, 1,2,3,4,5  POST-OPERATIVE DIAGNOSIS:   1. LEFT ANKLE PILON FRACTURE, TIBIA AND FIBULA 2. RETAINED EXTERNALFIXATOR LEFT ANKLE AND FOOT 3. ULCERATED PIN SITES LEG AND HEEL 4. LEFT FOOT MULTIPLE METATARSAL BASE FRACTURES, 1,2,3,4,5  PROCEDURE:  Procedure(s): 1. OPEN REDUCTION INTERNAL FIXATION OF LEFT ANKLE PILON FRACTURE, TIBIA AND FIBULA 2. REMOVAL EXTERNAL FIXATION  LEFT LEG (Left) 3. DEBRIDEMENT OF ULCERATED PIN SITES INCLUDING BONE 4. MANUAL APPLICATION OF STRESS UNDER FLUOROSCOPY LEFT FOOT LISFRANC FRACTURE DISLOCATIONS 5. RETENTION SUTURE CLOSURES 22 CM  SURGEON:  Surgeon(s) and Role:    DEWAINE Celena Sharper, MD - Primary  PHYSICIAN ASSISTANT: FRANCIS MT, PA-C  ANESTHESIA:   general  EBL:  100 mL   BLOOD ADMINISTERED:none  DRAINS: none   LOCAL MEDICATIONS USED:  NONE  SPECIMEN:  No Specimen  DISPOSITION OF SPECIMEN:  N/A  COUNTS:  YES  TOURNIQUET: None  DICTATION: Note written in EPIC  PLAN OF CARE: Discharge to home after PACU  PATIENT DISPOSITION:  PACU - hemodynamically stable.   Delay start of Pharmacological VTE agent (>24hrs) due to surgical blood loss or risk of bleeding: no  INDICATION FOR PROCEDURE: Blake Cruz is a 83 y.o. who sustained a displaced and shortened pilon fracture treated initially and provisionally with external fixation to restore length and alignment while waiting for adequate soft tissue swelling resolution.  The patient has been followed for 3 weeks awaiting resolution of soft tissue swelling sufficient to allow surgery and he now presents for definitive treatment.  I discussed with the patient and his family the risks and  benefits of surgery, including the possibility of infection, nerve injury, vessel injury, wound breakdown, arthritis, symptomatic hardware, DVT/ PE, loss of motion, malunion, nonunion, and need for further surgery among others.  We also specifically discussed the potential need to adopt minimal exposure techniques or to further stage surgery because of the elevated risk of soft tissue breakdown that could lead to amputation. These risks were acknowledged and consent provided to proceed.  SUMMARY OF PROCEDURE:  The patient was taken to the operating room after administration of preoperative antibiotics.  General anesthesia was induced.  Because of the time from injury the external fixator was retained for the standard prep and drape. The left lower extremity was cleaned with chlorhexidine  soap and scrub and then followed by Betadine scrub and paint. A time out was held. The fixator clamps were then loosened and the pins removed from the medial side, followed by a thorough scrubbing with chlorhexidine  and wash. The pin sites, which had ulcerated at the leg and heel around the pins and to a lesser extent at the metatarsals, were then debrided with curettes at the skin, subcutaneous tissues, muscle fascia layers, as well as the near and far bone cortices, removing all discernible devitalized necrotic tissue, bone debris, and desiccated fibrinous material. The calcaneal pin tract was debrided with curettage all the way through the bone canal to the opposite side of the calcaneus. This canal and all pin sites were thoroughly irrigated thoroughly with saline. The pins sites were protected from the field with ioban. Fresh gloves were applied by operative staff.    Because we had wrinkling in the anticipated area of  incision, we felt that it was most prudent to proceed with bridge fixation using limited dissection and medial incisions. Dissection was carried carefully down to the periosteum which was not disturbed.   A plate was measured for length and then slid in retrograde fashion along the medial cortex from the distal incision.  I began with standard fixation into the articular block in the subchondral area of the distal tibia.  Once the plate was maximally opposed to bone I proceeded with placement of additional lock screws.  Prior to doing this I made certain on AP and lateral views that the plate was in the appropriate position both distally and proximally.  After fixation in the distal block and before fixation proximally it appeared that reduction could be improved with definitive fixation of the fibula.  The lateral skin was sufficient to allow a lateral approach and reduction of alignment without violation of the periosteum.  I did open the skin and use K wires to position the plate in appropriate position after reducing the fracture.  I applied standard fixation distally and then a sequential fashion proximally in order to achieve the reduction and oppose the plate to the bone.  I then added additional locked fixation distally.  After repair of the lateral malleolus was complete I turned my attention back to the tibia.    I made a longitudinal incision along the proximal medial cortex elevated the plate to the appropriate position my assistant pulled traction to restore length and alignment and then I achieved fixation first through the slotted hole and then another standard screw most proximally and then to lock screws distal to the slotted hole.  C-arm was brought in to confirm accepatable reduction and hardware placement.  My assistant, Francis Mt, PAC, retracted the nerve and vessel to protect it throughout and also helped to place provisional fixation and produced compression while it was tightening definitive fixation.  Once the C-arm was in place, AP, lateral, and mortise views were obtained, and then, a stress view was performed in which we did not identify any lateral subluxation of the talus,  syndesmotic widening, or increase in the medial clear space.    Next attention was turned to the foot where it was carefully rotated in order to obtain AP oblique and lateral views.  This showed acceptable reduction of the comminuted fractures involving the bases of all metatarsals 1 through 5.  Because of the comminution screw fixation was contraindicated.  The wounds were irrigated thoroughly and closed in layered fashion using 2-0 Vicryl and 2-0 nylon.  Because of his severe chronic edema vertical mattress retention sutures had to be used over the course of the wounds which measured in total 22 cm. Sterile gently compressive dressing was applied and then, a posterior and stirrup splint.  The patient was awakened from anesthesia and transported to PACU in stable condition.  Again, Francis Mt, PAC, did assist me throughout.   PROGNOSIS: Blake Cruz is at increased risk for infection, nonunion, delayed union, and soft tissue complications because of his chronically edematous legs.  Fortunately they are under good control at present and risk were mitigated by our limited exposures. PT will assist with nonweightbearing for the next 6-8 weeks with protected graduated weightbearing thereafter and will also be on formal pharmacologic DVT prophylaxis.

## 2024-11-24 NOTE — Progress Notes (Signed)
 No changes overnight.  The risks and benefits of left ankle and foot repairs were discussed with the patient and his family at the bedside, including the possibility of infection, nerve injury, vessel injury, wound breakdown, arthritis, symptomatic hardware, DVT/ PE, loss of motion, malunion, nonunion, and need for further surgery among others.  We also specifically discussed the possible need to avoid internal fixation, use alternative fixation, or further stage surgery because of the elevated risk of soft tissue breakdown that could lead to amputation.  These risks were acknowledged and consent was provided to proceed.  Blake Bruch, MD [facility], [facility] [phone]

## 2024-11-25 ENCOUNTER — Telehealth (HOSPITAL_COMMUNITY): Payer: Self-pay

## 2024-11-25 ENCOUNTER — Other Ambulatory Visit (HOSPITAL_COMMUNITY): Payer: Self-pay

## 2024-11-25 ENCOUNTER — Encounter (HOSPITAL_COMMUNITY): Payer: Self-pay | Admitting: Orthopedic Surgery

## 2024-11-25 LAB — BASIC METABOLIC PANEL WITH GFR
Anion gap: 5 (ref 5–15)
Anion gap: 7 (ref 5–15)
Anion gap: 6 (ref 5–15)
BUN: 20 mg/dL (ref 8–23)
BUN: 21 mg/dL (ref 8–23)
BUN: 22 mg/dL (ref 8–23)
CO2: 31 mmol/L (ref 22–32)
CO2: 29 mmol/L (ref 22–32)
CO2: 28 mmol/L (ref 22–32)
Calcium: 8.6 mg/dL — ABNORMAL LOW (ref 8.9–10.3)
Calcium: 8.5 mg/dL — ABNORMAL LOW (ref 8.9–10.3)
Calcium: 8 mg/dL — ABNORMAL LOW (ref 8.9–10.3)
Chloride: 102 mmol/L (ref 98–111)
Chloride: 102 mmol/L (ref 98–111)
Chloride: 103 mmol/L (ref 98–111)
Creatinine, Ser: 1.35 mg/dL — ABNORMAL HIGH (ref 0.61–1.24)
Creatinine, Ser: 1.42 mg/dL — ABNORMAL HIGH (ref 0.61–1.24)
Creatinine, Ser: 1.35 mg/dL — ABNORMAL HIGH (ref 0.61–1.24)
GFR, Estimated: 52 mL/min — ABNORMAL LOW (ref >60)
GFR, Estimated: 49 mL/min — ABNORMAL LOW (ref >60)
GFR, Estimated: 52 mL/min — ABNORMAL LOW (ref >60)
Glucose, Bld: 135 mg/dL — ABNORMAL HIGH (ref 70–99)
Glucose, Bld: 103 mg/dL — ABNORMAL HIGH (ref 70–99)
Glucose, Bld: 150 mg/dL — ABNORMAL HIGH (ref 70–99)
Potassium: 5.2 mmol/L — ABNORMAL HIGH (ref 3.5–5.1)
Potassium: 4.2 mmol/L (ref 3.5–5.1)
Potassium: 4.3 mmol/L (ref 3.5–5.1)
Sodium: 138 mmol/L (ref 135–145)
Sodium: 138 mmol/L (ref 135–145)
Sodium: 137 mmol/L (ref 135–145)

## 2024-11-25 LAB — CBC
HCT: 30.8 % — ABNORMAL LOW (ref 39.0–52.0)
Hemoglobin: 9.4 g/dL — ABNORMAL LOW (ref 13.0–17.0)
MCH: 30.5 pg (ref 26.0–34.0)
MCHC: 30.5 g/dL (ref 30.0–36.0)
MCV: 100 fL (ref 80.0–100.0)
Platelets: 228 K/uL (ref 150–400)
RBC: 3.08 MIL/uL — ABNORMAL LOW (ref 4.22–5.81)
RDW: 18.8 % — ABNORMAL HIGH (ref 11.5–15.5)
WBC: 10.2 K/uL (ref 4.0–10.5)
nRBC: 0 % (ref 0.0–0.2)

## 2024-11-25 MED ORDER — SENNA 8.6 MG PO TABS
1.0000 | ORAL_TABLET | Freq: Every day | ORAL | Status: DC
  Administered 2024-11-25 – 2024-11-26 (×2): 8.6 mg via ORAL
  Filled 2024-11-25 (×2): qty 1

## 2024-11-25 MED ORDER — METHOCARBAMOL 500 MG PO TABS: 500.0000 mg | ORAL_TABLET | Freq: Three times a day (TID) | ORAL | 0 refills | Status: AC | PRN

## 2024-11-25 MED ORDER — VITAMIN B-12 1000 MCG PO TABS
1000.0000 ug | ORAL_TABLET | Freq: Every day | ORAL | Status: DC
  Administered 2024-11-25 – 2024-11-26 (×2): 1000 ug via ORAL
  Filled 2024-11-25 (×2): qty 1

## 2024-11-25 MED ORDER — HYDROMORPHONE HCL 2 MG PO TABS
1.0000 mg | ORAL_TABLET | ORAL | Status: DC | PRN
  Administered 2024-11-26 (×2): 1 mg via ORAL
  Filled 2024-11-25 (×2): qty 1

## 2024-11-25 MED ORDER — OXYCODONE HCL 5 MG PO TABS
5.0000 mg | ORAL_TABLET | Freq: Once | ORAL | Status: AC
  Administered 2024-11-25: 5 mg via ORAL
  Filled 2024-11-25: qty 1

## 2024-11-25 MED ORDER — LACTATED RINGERS IV BOLUS
500.0000 mL | Freq: Once | INTRAVENOUS | Status: AC
  Administered 2024-11-25: 500 mL via INTRAVENOUS

## 2024-11-25 MED ORDER — ACETAMINOPHEN 500 MG PO TABS
1000.0000 mg | ORAL_TABLET | Freq: Four times a day (QID) | ORAL | Status: DC
  Administered 2024-11-25 – 2024-11-26 (×6): 1000 mg via ORAL
  Filled 2024-11-25 (×6): qty 2

## 2024-11-25 MED ORDER — POLYETHYLENE GLYCOL 3350 17 G PO PACK: 17.0000 g | PACK | Freq: Every day | ORAL | Status: DC | PRN

## 2024-11-25 MED ORDER — VITAMIN D (ERGOCALCIFEROL) 1.25 MG (50000 UNIT) PO CAPS: 50000.0000 [IU] | ORAL_CAPSULE | ORAL | 5 refills | Status: AC

## 2024-11-25 MED ORDER — ACETAMINOPHEN 500 MG PO TABS: 1000.0000 mg | ORAL_TABLET | Freq: Four times a day (QID) | ORAL | 0 refills | Status: AC | PRN

## 2024-11-25 MED ORDER — APIXABAN 5 MG PO TABS
5.0000 mg | ORAL_TABLET | Freq: Two times a day (BID) | ORAL | Status: DC
  Administered 2024-11-25 – 2024-11-26 (×3): 5 mg via ORAL
  Filled 2024-11-25 (×3): qty 1

## 2024-11-25 MED ORDER — OXYCODONE HCL 5 MG PO TABS: 5.0000 mg | ORAL_TABLET | ORAL | 0 refills | Status: DC | PRN

## 2024-11-25 NOTE — Telephone Encounter (Signed)
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
## 2024-11-25 NOTE — Plan of Care (Signed)
°  Problem: Clinical Measurements: Goal: Will remain free from infection Outcome: Progressing   Problem: Nutrition: Goal: Adequate nutrition will be maintained Outcome: Progressing   Problem: Coping: Goal: Level of anxiety will decrease Outcome: Progressing
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
## 2024-11-25 NOTE — TOC Progression Note (Addendum)
 Transition of Care [facility]) - Progression Note    Patient Details  Name: Blake Cruz MRN: [id number] Date of Birth: 02-Aug-1941  [facility] CM/SW Contact  Bridget Cordella Simmonds, LCSW Phone Number: 11/25/2024, 10:05 AM  Clinical Narrative:   CSW confirmed with [facility]: they can receive pt today.  SNF auth request submitted in [place].   1340: SNF auth remains pending.     Barriers to Discharge: Continued Medical Work up, SNF Pending bed offer               Expected Discharge Plan and Services In-house Referral: Clinical Social Work     Living arrangements for the past 2 months: Single Family Home                                       Social Drivers of Health (SDOH) Interventions SDOH Screenings   Food Insecurity: Unknown (11/05/2024)  Housing: Low Risk  (11/05/2024)  Transportation Needs: No Transportation Needs (11/05/2024)  Utilities: Not At Risk (11/05/2024)  Financial Resource Strain: Low Risk (10/28/2017)   Received from [facility]  Physical Activity: Inactive (10/28/2017)   Received from [facility]  Social Connections: Moderately Integrated (11/09/2024)  Stress: No Stress Concern Present (10/28/2017)   Received from [facility]  Tobacco Use: Low Risk  (11/24/2024)    Readmission Risk Interventions     No data to display
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
## 2024-11-26 LAB — CBC
HCT: 29.6 % — ABNORMAL LOW (ref 39.0–52.0)
Hemoglobin: 8.9 g/dL — ABNORMAL LOW (ref 13.0–17.0)
MCH: 30.8 pg (ref 26.0–34.0)
MCHC: 30.1 g/dL (ref 30.0–36.0)
MCV: 102.4 fL — ABNORMAL HIGH (ref 80.0–100.0)
Platelets: 160 K/uL (ref 150–400)
RBC: 2.89 MIL/uL — ABNORMAL LOW (ref 4.22–5.81)
RDW: 18.6 % — ABNORMAL HIGH (ref 11.5–15.5)
WBC: 5.7 K/uL (ref 4.0–10.5)
nRBC: 0 % (ref 0.0–0.2)

## 2024-11-26 LAB — BASIC METABOLIC PANEL WITH GFR
Anion gap: 5 (ref 5–15)
BUN: 19 mg/dL (ref 8–23)
CO2: 28 mmol/L (ref 22–32)
Calcium: 8.2 mg/dL — ABNORMAL LOW (ref 8.9–10.3)
Chloride: 103 mmol/L (ref 98–111)
Creatinine, Ser: 1.11 mg/dL (ref 0.61–1.24)
GFR, Estimated: >60 mL/min (ref >60)
Glucose, Bld: 106 mg/dL — ABNORMAL HIGH (ref 70–99)
Potassium: 4.8 mmol/L (ref 3.5–5.1)
Sodium: 136 mmol/L (ref 135–145)

## 2024-11-26 MED ORDER — MELATONIN 3 MG PO TABS: 3.0000 mg | ORAL_TABLET | Freq: Every day | ORAL | Status: AC

## 2024-11-26 MED ORDER — GABAPENTIN 100 MG PO CAPS: 200.0000 mg | ORAL_CAPSULE | Freq: Three times a day (TID) | ORAL | Status: AC

## 2024-11-26 MED ORDER — POLYETHYLENE GLYCOL 3350 17 G PO PACK
17.0000 g | PACK | Freq: Every day | ORAL | Status: DC
  Administered 2024-11-26: 17 g via ORAL
  Filled 2024-11-26: qty 1

## 2024-11-26 MED ORDER — FUROSEMIDE 40 MG PO TABS: 40.0000 mg | ORAL_TABLET | Freq: Every day | ORAL | Status: AC

## 2024-11-26 MED ORDER — LIDOCAINE HCL URETHRAL/MUCOSAL 2 % EX GEL: 1.0000 | Freq: Every day | CUTANEOUS | Status: AC | PRN

## 2024-11-26 MED ORDER — OXYCODONE HCL 5 MG PO TABS: 5.0000 mg | ORAL_TABLET | Freq: Four times a day (QID) | ORAL | 0 refills | Status: AC | PRN

## 2024-11-26 MED ORDER — HYDROXYZINE HCL 25 MG PO TABS: 25.0000 mg | ORAL_TABLET | Freq: Three times a day (TID) | ORAL | Status: AC | PRN

## 2024-11-26 MED ORDER — OXYCODONE HCL 5 MG PO TABS: 5.0000 mg | ORAL_TABLET | ORAL | 0 refills | Status: AC | PRN

## 2024-11-26 MED ORDER — APIXABAN 5 MG PO TABS: 5.0000 mg | ORAL_TABLET | Freq: Two times a day (BID) | ORAL | Status: AC

## 2024-11-26 NOTE — Plan of Care (Signed)

## 2024-11-26 NOTE — TOC Transition Note (Signed)
 Transition of Care [facility]) - Discharge Note   Patient Details  Name: Blake Cruz MRN: [id number] Date of Birth: 1941/03/30  Transition of Care [facility]) CM/SW Contact:  Bridget Cordella Simmonds, LCSW Phone Number: 11/26/2024, 2:53 PM   Clinical Narrative:   Pt discharging to [facility]. RN report to [phone].   PTAR called [phone].   Final next level of care: Skilled Nursing Facility Barriers to Discharge: Barriers Resolved   Patient Goals and CMS Choice Patient states their goals for this hospitalization and ongoing recovery are:: pt unable to state goal          Discharge Placement              Patient chooses bed at:  [facility]) Patient to be transferred to facility by: ptar Name of family member notified: daughter Edna Patient and family notified of of transfer: 11/26/24  Discharge Plan and Services Additional resources added to the After Visit Summary for   In-house Referral: Clinical Social Work                                   Social Drivers of Health (SDOH) Interventions SDOH Screenings   Food Insecurity: Unknown (11/05/2024)  Housing: Low Risk (11/05/2024)  Transportation Needs: No Transportation Needs (11/05/2024)  Utilities: Not At Risk (11/05/2024)  Social Connections: Moderately Integrated (11/09/2024)  Tobacco Use: Low Risk (11/24/2024)     Readmission Risk Interventions     No data to display

## 2024-11-26 NOTE — TOC Progression Note (Addendum)
 Transition of Care [facility]) - Progression Note    Patient Details  Name: Blake Cruz MRN: [id number] Date of Birth: Sep 04, 1941  Transition of Care [facility]) CM/SW Contact  Bridget Cordella Simmonds, LCSW Phone Number: 11/26/2024, 10:38 AM  Clinical Narrative:   SNF auth request still pending in [place].   1000: CSW spoke with [employer]: request is awating review.  No documentation needed.   1335: SNF auth approved: [id number], 5 days: 12/11-12/15.  CSW confirmed with Kristy/[place]: they can receive pt today.  MD informed.     Barriers to Discharge: Continued Medical Work up, SNF Pending bed offer               Expected Discharge Plan and Services In-house Referral: Clinical Social Work     Living arrangements for the past 2 months: Single Family Home                                       Social Drivers of Health (SDOH) Interventions SDOH Screenings   Food Insecurity: Unknown (11/05/2024)  Housing: Low Risk (11/05/2024)  Transportation Needs: No Transportation Needs (11/05/2024)  Utilities: Not At Risk (11/05/2024)  Social Connections: Moderately Integrated (11/09/2024)  Tobacco Use: Low Risk (11/24/2024)    Readmission Risk Interventions     No data to display
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
## 2024-11-27 NOTE — Progress Notes (Signed)
 Occluded tibial artery is not related to acute trauma and likely chronic as stated by vascular surgery evaluation intraoperatively.

## 2024-12-15 NOTE — Discharge Summary (Addendum)
 [facility] Service Physician Discharge Summary   Admit date: 12/12/2024 Discharge date: 12/15/2024 Discharge Service: [facility] Service Admitting Provider: Darryle Norman Browner, MD Discharge Attending Physician: Dorn Debby Glatter, MD Discharge to: Home with Home Health and/or PT/OT Consults: None PCP: Casey Curia, NP  Discharge Diagnoses:  Principal Problem:   Pneumonia of left lower lobe due to infectious organism Active Problems:   Acquired absence of other genital organ(s)   Anemia   Chronic kidney disease, stage 3a (CMS-HCC)   Class 2 severe obesity due to excess calories with serious comorbidity in adult   Essential (primary) hypertension   Multiple closed fractures of metatarsal bone of left foot   Influenza A   Hospital Course:    PCP Follow Up: [ ]  Foley change with urology [ ]  Ortho plan for LLE fractures [ ]  Anemia [ ]  Polypharmacy - please scrub his medication list after these multiple hospitalizations / SNF stays [ ]  Apixaban  duration   LOS: 2 days    ## Influenza A  pneumonia  sepsis: Patient presenting from [facility] with 1 week of cough and cold symptoms, which acutely worsened the day prior to admission with vomiting and confusion.  Admission swab was equivocal for influenza A, which given current incidence is likely that he has gotten from facility.  Chest x-ray showing focal consolidation in the left midlung consistent with pneumonia.  Additionally, presented with hypotension, leukocytosis, and tachypnea concerning for sepsis.  He was started on broad-spectrum antibiotics with vancomycin and ceftriaxone  while his MRSA swab was pending.  It returned negative just prior to discharge.  Also, started on Tamiflu.  He had a rapid clinical improvement and was felt safe for discharge to home. -complete course of Tamiflu -transition to Augmentin to complete 5 day course of antibiotics -sputum culture pending   ## Anemia:  Hemoglobin 7.2 Emergency Department with MCV 93.3.  Was given 1 unit of packed red blood cells in the emergency department.  He also received transfusion for hemoglobin less than 7 postoperatively during his recent admission at [facility].  At that time thought to be secondary to known iron  and B12 deficiency, phlebotomy, anemia chronic disease.  Of note, his Hg was >12 as recently as summer of 2025. - FOBT negative (performed given report of black stools) - Hgb stable after 1 unit 12/27 - Discharged on oral iron  daily   ## Hypertension: Home benazepril held at last discharge.  Will continue to hold.   ## Fractures of left tibia and metatarsal: Patient recently admitted to [facility] for [facility] with crush injury of left foot.  Suffered left ankle fracture (pilon-tib/fib) and left tarsometatarsal fracture and dislocation.  Underwent surgery on 11/20 and 12/9.  Has outpatient orthopedic follow-up 12/31. - On apixaban  for prolonged DVT prophylaxis - Has DME needs - The patient requires a wheelchair 1. The patient has mobility limitations that interfere with performing ADL's and MADL's and a WC will improve this 2. His mobility limitations cannot be corrected with a cane or walker  3. The patient expressed a willingness to use a wheelchair  4. The patient has adequate space in their living environment for a wheelchair  5. The patient can propel his w/c independently and he has family support to propel him    ## Chronic suprapubic catheter  prostate cancer status post prostatectomy: Urinalysis with nitrites and pyuria in the emergency department, asymptomatic.  Urology exchanged suprapubic catheter: 11/20. - Continue oxybutynin  for bladder spasms - Continue memantine  Procedures:   None ___________________________________________________________________ Discharge Day Services:   Pt seen on the day of discharge and determined appropriate for discharge.  Day of Discharge  Services:  Subjective:  Interval History: NAEON   ROS negative for fever, chills, night sweats, nausea, vomiting, diarrhea, constipation, chest pain, SOB, DOE, Abdominal pain, dysuria, rash, vision changes, headache, unilateral weakness, numbness or tingling  Objective:  Vital signs in last 24 hours: Temp:  [36.2 C (97.2 F)-37 C (98.6 F)] 36.3 C (97.3 F) Pulse:  [62-75] 69 Resp:  [20] 20 BP: (106-130)/(56-71) 117/69 MAP (mmHg):  [72-86] 82 SpO2:  [94 %-95 %] 94 %  Intake/Output last 3 shifts: I/O last 3 completed shifts: In: -  Out: 2800 [Urine:2800]  Weight:  Wt Readings from Last 4 Encounters:  12/13/24 (!) 120.3 kg (265 lb 3.4 oz)  05/07/15 (!) 113.4 kg (250 lb)  05/06/15 (!) 113.6 kg (250 lb 6.4 oz)  04/28/15 (!) 112.8 kg (248 lb 11.2 oz)    Physical Exam Vitals and nursing note reviewed.  Constitutional:      General: He is not in acute distress.    Appearance: He is obese. He is not toxic-appearing.  Eyes:     General: No scleral icterus. Cardiovascular:     Rate and Rhythm: Normal rate and regular rhythm.     Pulses: Normal pulses.     Heart sounds: Normal heart sounds.  Pulmonary:     Effort: Pulmonary effort is normal.     Comments: Coarse breath sounds but good air movement. Abdominal:     General: Abdomen is flat. Bowel sounds are normal.     Palpations: Abdomen is soft.  Musculoskeletal:     Right lower leg: No edema.     Comments: LLE in cast  Neurological:     General: No focal deficit present.     Mental Status: He is alert and oriented to person, place, and time. Mental status is at baseline.     Condition at Discharge: good  Length of Discharge: I spent greater than 30 mins in the discharge of this patient. ___________________________________________________________________ Discharge Medications:      Your Medication List     STOP taking these medications    meclizine 12.5 mg tablet Commonly known as: ANTIVERT    methocarbamol  500 MG tablet Commonly known as: ROBAXIN    oxyCODONE  5 MG immediate release tablet Commonly known as: ROXICODONE        START taking these medications    amoxicillin-clavulanate 875-125 mg per tablet Commonly known as: AUGMENTIN Take 1 tablet by mouth two (2) times a day.   ferrous sulfate 325 (65 FE) MG tablet Take 1 tablet (325 mg total) by mouth daily.       CHANGE how you take these medications    oseltamivir 30 MG capsule Commonly known as: TAMIFLU Take 1 capsule (30 mg total) by mouth two (2) times a day. What changed: Another medication with the same name was added. Make sure you understand how and when to take each.   oseltamivir 30 MG capsule Commonly known as: TAMIFLU Take 1 capsule (30 mg total) by mouth two (2) times a day. What changed: You were already taking a medication with the same name, and this prescription was added. Make sure you understand how and when to take each.       CONTINUE taking these medications    apixaban  5 mg Tab Commonly known as: ELIQUIS  Take 1 tablet (5 mg total) by mouth two (2)  times a day.   aspirin 81 MG tablet Commonly known as: ECOTRIN Take 1 tablet (81 mg total) by mouth daily.   benazepril 20 MG tablet Commonly known as: LOTENSIN Take 1 tablet (20 mg total) by mouth daily.   cyanocobalamin  (vitamin B-12) 1000 MCG tablet Take 1 tablet (1,000 mcg total) by mouth daily.   DEXILANT 60 mg capsule Generic drug: dexlansoprazole Take 1 capsule (60 mg total) by mouth daily. As directed   escitalopram  oxalate 20 MG tablet Commonly known as: LEXAPRO  Take 1 tablet (20 mg total) by mouth daily.   furosemide  40 MG tablet Commonly known as: LASIX  Take 1 tablet (40 mg total) by mouth daily.   gabapentin  100 MG capsule Commonly known as: NEURONTIN  Take 2 capsules (200 mg total) by mouth Three (3) times a day.   hydrOXYzine  25 MG tablet Commonly known as: ATARAX  Take 1 tablet (25 mg total) by mouth  every eight (8) hours as needed for itching.   lidocaine  2% gel 2 % Jelp Commonly known as: XYLOCAINE  Insert 20 mL into the urethra Three (3) times a day as needed (urethral pain).   melatonin 3 mg Tab Take 1 tablet (3 mg total) by mouth every evening.   methenamine 1 gram tablet Commonly known as: HIPREX Take 1 tablet (1 g total) by mouth in the morning.   omeprazole 20 MG capsule Commonly known as: PriLOSEC Take 1 capsule (20 mg total) by mouth daily.   oxybutynin  5 MG tablet Commonly known as: DITROPAN  Take 1 tablet (5 mg total) by mouth two (2) times a day.   pantoprazole  40 MG tablet Commonly known as: Protonix  Take 1 tablet (40 mg total) by mouth daily before breakfast.   polyethylene glycol 17 gram packet Commonly known as: MIRALAX  Take 17 g by mouth daily.   PRESERVISION AREDS 2 ORAL Take 1 capsule by mouth daily.   senna 8.6 mg tablet Commonly known as: SENOKOT Take 1 tablet by mouth nightly.   TYLENOL  EXTRA STRENGTH 500 MG tablet Generic drug: acetaminophen  Take 2 tablets (1,000 mg total) by mouth every six (6) hours as needed for pain.   VITAMIN D2 1,250 mcg (50,000 unit) capsule Generic drug: ergocalciferol  Take 1 capsule (1,250 mcg total) by mouth once a week.       ___________________________________________________________________ Hospital Tests/Labs:   Pending Test Results (if blank, then none): Pending Labs     Order Current Status   Blood Culture #2 Preliminary result   Blood Culture x1 Preliminary result   Lower Respiratory Culture Preliminary result      Hospital Radiology: ECG 12 Lead Result Date: 12/12/2024 NORMAL SINUS RHYTHM LOW VOLTAGE QRS NONSPECIFIC ST ABNORMALITY ABNORMAL ECG  XR Chest Portable Result Date: 12/12/2024 EXAM: XR CHEST PORTABLE ACCESSION: [id number] [facility] REPORT DATE: 12/12/2024 3:15 PM CLINICAL INDICATION: FEVER  TECHNIQUE: Single View AP Chest Radiograph. COMPARISON: None FINDINGS: Focal consolidation in the  left midlung. Left basilar atelectasis.  No pleural effusion or pneumothorax. Cardiac silhouette is within normal limits for size given AP technique.   Focal consolidation in the left midlung may reflect early pneumonia. Recommend follow-up with two-view chest radiographs in 6 to 8 weeks to assess resolution.    Most Recent Labs: Recent Labs    Units 12/13/24 0709 12/14/24 0920 12/15/24 0730  WBC 10*9/L 7.7 8.2 6.1  HGB g/dL 7.2* 8.1* 7.4*  HCT % 78.5* 24.5* 22.1*  MCV fL 92.8 93.8 92.9  PLT 10*9/L 129* 173 169   Recent Labs  Units 12/12/24 1356 12/13/24 0709 12/14/24 0920 12/15/24 0729  NA mmol/L 133* 134* 135 137  K mmol/L 4.6 4.2 4.2 4.6  CL mmol/L 102 105 105 110*  CO2 mmol/L 22.0 24.0 22.0 20.0*  BUN mg/dL 47* 34* 24* 20  CREATININE mg/dL 8.69 8.89 8.99 9.09  GLU mg/dL 862* 881* 834* 891*  CALCIUM mg/dL 8.1* 8.3* 8.2* 8.4*  ALBUMIN  g/dL 3.8  --   --   --   ALT U/L 14  --   --   --   AST U/L 23  --   --   --   ALKPHOS U/L 81  --   --   --   BILITOT mg/dL 0.8  --   --   --   PROT g/dL 6.9  --   --   --    Recent Labs    Units 12/12/24 1356  LACTATE mmol/L 1.9   No results for input(s): INR, PT, APTT in the last 168 hours. No results for input(s): TROPONINI, CKMB, PROBNP in the last 168 hours.  Invalid input(s): CK No results for input(s): O2SOUR, FIO2ART, PHART, PCO2ART, PO2ART, HCO3ART, O2SATART, BEART in the last 168 hours. No results for input(s): PHVEN, PCO2VEN, PO2VEN, HCO3VEN, O2SATVEN, BEVEN in the last 168 hours.  Recent Labs    12/12/24 1358  WBCUA 25*  NITRITE Positive*  LEUKOCYTESUR Moderate*  BACTERIA Many*  RBCUA 6*  BLOODU Trace*  GLUCOSEU Negative  PROTEINUA Negative  KETONESU Negative   No results for input(s): PREGTESTUR in the last 72 hours. No results for input(s): OPIAU, BENZU, TRICYCLIC, PCPU, AMPHU, COCAU, CANNAU, BARBU, ETOH, ACETAMIN, SALICYLATE in the last 72  hours. Microbiology Results (last day)     Procedure Component Value Date/Time Date/Time   Blood Culture #2 [[id number]]  (Normal) Collected: 12/12/24 1528   Lab Status: Preliminary result Specimen: Blood from 1 Peripheral Draw Updated: 12/14/24 1545    Blood Culture, Routine No Growth at 48 hours   Lower Respiratory Culture [[id number]] Collected: 12/13/24 1022   Lab Status: Preliminary result Specimen: Sputum Expectorated Updated: 12/14/24 1447    Lower Respiratory Culture TOO YOUNG TO READ    Gram Stain <10  Epithelial cells/LPF     >25 PMNS/LPF     1+ Gram positive cocci     Acceptable for culture   Narrative:     Specimen Source: Sputum Expectorated   Blood Culture x1 [[id number]]  (Normal) Collected: 12/12/24 1356   Lab Status: Preliminary result Specimen: Blood from 1 Peripheral Draw Updated: 12/14/24 1415    Blood Culture, Routine No Growth at 48 hours      ___________________________________________________________________ Discharge Instructions    Activity Instructions     Activity as tolerated     Please continue to restrict your activity as directed.  You are not to bear weight on your fractured leg.  Keep your orthopedics appointment for tomorrow, December 31.  You were admitted for the flu and for a bacterial infection.  We are giving you both an antibiotic called Augmentin as well as a flu medication called Tamiflu.  You can take both of those pills again this evening.  They are both twice a day medications.  Lastly you continued to be quite anemic during your stay.  I am prescribing an iron  pill for you to take once a day.      Patient's family to call for appointment with PCP on 12/18/24 or the week of 12/21/24.  Follow Up instructions and Outpatient Referrals  Ambulatory Referral to Home Health     Reason for referral: Physical Deconditioning   Physician to follow patient's care: PCP   Disciplines requested: Physical Therapy   Physical Therapy requested:  Evaluate and treat   Requested [facility] Date: 12/21/2024   Requested follow up plan: You would evaluate and manage.     ___________________________________________________________________
